# Patient Record
Sex: Female | Born: 1950 | Race: White | Hispanic: No | Marital: Married | Smoking: Never smoker
Health system: Southern US, Community
[De-identification: ages and names within clinical notes are randomized; demographics above are authoritative.]

## PROBLEM LIST (undated history)

## (undated) DIAGNOSIS — I4891 Unspecified atrial fibrillation: Secondary | ICD-10-CM

## (undated) DIAGNOSIS — T7840XA Allergy, unspecified, initial encounter: Secondary | ICD-10-CM

## (undated) DIAGNOSIS — R002 Palpitations: Secondary | ICD-10-CM

## (undated) DIAGNOSIS — F418 Other specified anxiety disorders: Secondary | ICD-10-CM

## (undated) DIAGNOSIS — F419 Anxiety disorder, unspecified: Secondary | ICD-10-CM

## (undated) DIAGNOSIS — E039 Hypothyroidism, unspecified: Secondary | ICD-10-CM

## (undated) DIAGNOSIS — S62101A Fracture of unspecified carpal bone, right wrist, initial encounter for closed fracture: Secondary | ICD-10-CM

## (undated) DIAGNOSIS — K219 Gastro-esophageal reflux disease without esophagitis: Secondary | ICD-10-CM

## (undated) DIAGNOSIS — K589 Irritable bowel syndrome without diarrhea: Secondary | ICD-10-CM

## (undated) DIAGNOSIS — E079 Disorder of thyroid, unspecified: Secondary | ICD-10-CM

## (undated) DIAGNOSIS — B001 Herpesviral vesicular dermatitis: Secondary | ICD-10-CM

## (undated) HISTORY — DX: Palpitations: R00.2

## (undated) HISTORY — DX: Anxiety disorder, unspecified: F41.9

## (undated) HISTORY — PX: TUBAL LIGATION: SHX77

## (undated) HISTORY — PX: BREAST EXCISIONAL BIOPSY: SUR124

## (undated) HISTORY — DX: Gastro-esophageal reflux disease without esophagitis: K21.9

## (undated) HISTORY — DX: Unspecified atrial fibrillation: I48.91

## (undated) HISTORY — DX: Disorder of thyroid, unspecified: E07.9

## (undated) HISTORY — DX: Other specified anxiety disorders: F41.8

## (undated) HISTORY — DX: Allergy, unspecified, initial encounter: T78.40XA

## (undated) HISTORY — DX: Herpesviral vesicular dermatitis: B00.1

## (undated) HISTORY — PX: HERNIA REPAIR: SHX51

---

## 1997-07-25 ENCOUNTER — Encounter: Admission: RE | Admit: 1997-07-25 | Discharge: 1997-08-07 | Payer: Self-pay | Admitting: *Deleted

## 1997-10-23 ENCOUNTER — Other Ambulatory Visit: Admission: RE | Admit: 1997-10-23 | Discharge: 1997-10-23 | Payer: Self-pay | Admitting: *Deleted

## 1998-06-12 ENCOUNTER — Ambulatory Visit (HOSPITAL_COMMUNITY): Admission: RE | Admit: 1998-06-12 | Discharge: 1998-06-12 | Payer: Self-pay | Admitting: Gastroenterology

## 1999-01-10 ENCOUNTER — Other Ambulatory Visit: Admission: RE | Admit: 1999-01-10 | Discharge: 1999-01-10 | Payer: Self-pay | Admitting: *Deleted

## 1999-02-26 ENCOUNTER — Ambulatory Visit (HOSPITAL_COMMUNITY): Admission: RE | Admit: 1999-02-26 | Discharge: 1999-02-26 | Payer: Self-pay | Admitting: General Surgery

## 1999-02-26 ENCOUNTER — Encounter (INDEPENDENT_AMBULATORY_CARE_PROVIDER_SITE_OTHER): Payer: Self-pay | Admitting: Specialist

## 1999-02-26 ENCOUNTER — Encounter: Payer: Self-pay | Admitting: General Surgery

## 2000-02-26 ENCOUNTER — Other Ambulatory Visit: Admission: RE | Admit: 2000-02-26 | Discharge: 2000-02-26 | Payer: Self-pay | Admitting: *Deleted

## 2000-04-29 ENCOUNTER — Other Ambulatory Visit: Admission: RE | Admit: 2000-04-29 | Discharge: 2000-04-29 | Payer: Self-pay | Admitting: *Deleted

## 2000-04-29 ENCOUNTER — Encounter (INDEPENDENT_AMBULATORY_CARE_PROVIDER_SITE_OTHER): Payer: Self-pay

## 2000-07-06 ENCOUNTER — Other Ambulatory Visit: Admission: RE | Admit: 2000-07-06 | Discharge: 2000-07-06 | Payer: Self-pay | Admitting: *Deleted

## 2005-03-13 ENCOUNTER — Ambulatory Visit: Payer: Self-pay | Admitting: Internal Medicine

## 2006-05-13 ENCOUNTER — Ambulatory Visit (HOSPITAL_COMMUNITY): Admission: RE | Admit: 2006-05-13 | Discharge: 2006-05-13 | Payer: Self-pay | Admitting: Internal Medicine

## 2007-02-17 DIAGNOSIS — K219 Gastro-esophageal reflux disease without esophagitis: Secondary | ICD-10-CM

## 2007-02-17 HISTORY — DX: Gastro-esophageal reflux disease without esophagitis: K21.9

## 2010-04-19 ENCOUNTER — Emergency Department (HOSPITAL_COMMUNITY): Admission: EM | Admit: 2010-04-19 | Discharge: 2010-04-20 | Payer: Self-pay | Admitting: Internal Medicine

## 2011-07-27 ENCOUNTER — Ambulatory Visit: Payer: Self-pay | Admitting: Family Medicine

## 2011-07-27 VITALS — BP 124/80 | HR 68 | Temp 97.8°F | Resp 16 | Ht 67.5 in | Wt 171.8 lb

## 2011-07-27 DIAGNOSIS — J019 Acute sinusitis, unspecified: Secondary | ICD-10-CM

## 2011-07-27 MED ORDER — AMOXICILLIN 875 MG PO TABS: 1750.0000 mg | ORAL_TABLET | Freq: Two times a day (BID) | ORAL | Status: AC

## 2011-07-27 MED ORDER — PREDNISONE 20 MG PO TABS: ORAL_TABLET | ORAL | Status: AC

## 2011-07-27 NOTE — Progress Notes (Signed)
  Subjective:    Patient ID: Shannon Adkins, female    DOB: 11/21/1950, 61 y.o.   MRN: [id number]  HPI 61 yo female with URI symptoms off and on since Christmas.  This episode for about week.  Cough, facial pressure, sinus pain, yellow phlegm from nose and with coughing.  NO fever.  ST initially, now better.  NO ear probs, nausea, headache.  Tried OTC generic sudafed one day - didn't do anything.  No antibiotics.    Review of Systems Negative except as per HPI     Objective:   Physical Exam  Constitutional: She appears well-developed. No distress.  HENT:  Right Ear: Tympanic membrane, external ear and ear canal normal. Tympanic membrane is not injected, not scarred, not perforated, not erythematous, not retracted and not bulging.  Left Ear: Tympanic membrane, external ear and ear canal normal. Tympanic membrane is not injected, not scarred, not perforated, not erythematous, not retracted and not bulging.  Nose: Mucosal edema present. No rhinorrhea. Right sinus exhibits no maxillary sinus tenderness and no frontal sinus tenderness. Left sinus exhibits maxillary sinus tenderness. Left sinus exhibits no frontal sinus tenderness.  Mouth/Throat: Uvula is midline, oropharynx is clear and moist and mucous membranes are normal. No oropharyngeal exudate or tonsillar abscesses.  Cardiovascular: Normal rate, regular rhythm, normal heart sounds and intact distal pulses.   No murmur heard. Pulmonary/Chest: Effort normal and breath sounds normal. No respiratory distress. She has no wheezes. She has no rales.  Lymphadenopathy:       Head (right side): No submandibular and no preauricular adenopathy present.       Head (left side): No submandibular and no preauricular adenopathy present.       Right cervical: No superficial cervical and no posterior cervical adenopathy present.      Left cervical: No superficial cervical and no posterior cervical adenopathy present.       Right: No supraclavicular  adenopathy present.       Left: No supraclavicular adenopathy present.  Skin: Skin is warm and dry.          Assessment & Plan:  Sinusitis - amox and prednisone

## 2011-07-31 ENCOUNTER — Telehealth: Payer: Self-pay

## 2011-07-31 NOTE — Telephone Encounter (Signed)
Patient thinks she is have an allergic reaction to the medication she is taking she has broken out in a rash, please contact to advice her on what she needs to do.

## 2011-07-31 NOTE — Telephone Encounter (Signed)
Spoke with patient, she stated Dr. Georgiana Shore started her on Amoxicillin and she started it Sunday pm.  Patient woke up this morning and had hives on her body, and itching.    Patient has stopped the Amox, and is still using Prednisone and Claritin to help with symptoms.  She is not having any SOB or swelling, but complains that she is still having the same symptoms as when she was seen, plus her left eye feels irritated.    Please advise recommendation of new abx and other meds to help with hives.  Call into [facility].

## 2011-08-01 ENCOUNTER — Telehealth: Payer: Self-pay | Admitting: *Deleted

## 2011-08-01 MED ORDER — TRIAMCINOLONE ACETONIDE 0.1 % EX CREA: TOPICAL_CREAM | Freq: Two times a day (BID) | CUTANEOUS | Status: AC

## 2011-08-01 MED ORDER — CLARITHROMYCIN ER 500 MG PO TB24: 1000.0000 mg | ORAL_TABLET | Freq: Every day | ORAL | Status: AC

## 2011-08-01 NOTE — Telephone Encounter (Signed)
.  [facility] PT STATES SHE HAD A REACTION TO THE AMOX AND NEEDED TO KNOW WHAT TO TAKE IN THE PLACE OF IT. HAVE DEVELOPED HIVES AND WOULD LIKE TO HAVE A CREAM, ALSO WOULD LIKE TO HAVE SOME DROPS FOR HER EYE. PLEASE CALL [phone]  [facility] [address]

## 2011-08-01 NOTE — Telephone Encounter (Signed)
Bertram Savin [place], [place] 07/31/2011 3:48 PM Signed  Spoke with patient, she stated Dr. Georgiana Shore started her on Amoxicillin and she started it Sunday pm. Patient woke up this morning and had hives on her body, and itching.  Patient has stopped the Amox, and is still using Prednisone and Claritin to help with symptoms. She is not having any SOB or swelling, but complains that she is still having the same symptoms as when she was seen, plus her left eye feels irritated.  Please advise recommendation of new abx and other meds to help with hives. Call into [facility]. Lauretta Grill 07/31/2011 9:47 AM Signed  Patient thinks she is have an allergic reaction to the medication she is taking she has broken out in a rash, please contact to advice her on what she needs to do.

## 2011-08-01 NOTE — Telephone Encounter (Signed)
Pt has another chart please disregard .

## 2011-08-01 NOTE — Telephone Encounter (Signed)
Called pt to notify of new Rx. Pt also requested triamcinolone cream for her hives. Called in rx for triamcinolone per Chelle's verbal order as written

## 2011-08-01 NOTE — Telephone Encounter (Signed)
She'll need evaluation for her eye.  Biaxin Rx'd for her sinusitis.

## 2012-05-10 ENCOUNTER — Other Ambulatory Visit: Payer: Self-pay | Admitting: Family Medicine

## 2012-05-11 NOTE — Telephone Encounter (Signed)
Needs OV.  

## 2013-09-07 ENCOUNTER — Ambulatory Visit (INDEPENDENT_AMBULATORY_CARE_PROVIDER_SITE_OTHER): Payer: No Typology Code available for payment source | Admitting: Family Medicine

## 2013-09-07 VITALS — BP 110/72 | HR 61 | Temp 98.0°F | Resp 16 | Ht 67.5 in | Wt 165.0 lb

## 2013-09-07 DIAGNOSIS — F32A Depression, unspecified: Secondary | ICD-10-CM | POA: Insufficient documentation

## 2013-09-07 DIAGNOSIS — J309 Allergic rhinitis, unspecified: Secondary | ICD-10-CM

## 2013-09-07 DIAGNOSIS — F329 Major depressive disorder, single episode, unspecified: Secondary | ICD-10-CM

## 2013-09-07 DIAGNOSIS — F419 Anxiety disorder, unspecified: Secondary | ICD-10-CM

## 2013-09-07 DIAGNOSIS — F341 Dysthymic disorder: Secondary | ICD-10-CM

## 2013-09-07 HISTORY — DX: Allergic rhinitis, unspecified: J30.9

## 2013-09-07 HISTORY — DX: Depression, unspecified: F32.A

## 2013-09-07 MED ORDER — MONTELUKAST SODIUM 10 MG PO TABS: 10.0000 mg | ORAL_TABLET | Freq: Every day | ORAL | Status: DC

## 2013-09-07 MED ORDER — FLUTICASONE PROPIONATE 50 MCG/ACT NA SUSP: 2.0000 | Freq: Every day | NASAL | Status: DC

## 2013-09-07 MED ORDER — VENLAFAXINE HCL ER 75 MG PO CP24: 75.0000 mg | ORAL_CAPSULE | Freq: Every day | ORAL | Status: DC

## 2013-09-07 MED ORDER — FEXOFENADINE-PSEUDOEPHED ER 60-120 MG PO TB12: 1.0000 | ORAL_TABLET | Freq: Two times a day (BID) | ORAL | Status: DC

## 2013-09-07 MED ORDER — CITALOPRAM HYDROBROMIDE 20 MG PO TABS: 20.0000 mg | ORAL_TABLET | Freq: Every day | ORAL | Status: DC

## 2013-09-07 NOTE — Progress Notes (Addendum)
Subjective:  This chart was scribed for Ethelda Chick, MD by Leone Payor, ED Scribe. This patient was seen in room 9 and the patient's care was started 4:24 PM.    Patient ID: Shannon Adkins, female    DOB: Nov 13, 1950, 63 y.o.   MRN: [id number]  HPI HPI Comments: Shannon Adkins is a 63 y.o. female who presents to [facility] requesting medication refill/change today for anxiety with depression. She reports taking citalopram for many years but states it makes her sleep more (~10 hours nightly). She is wondering if there is any other medication that would be less sedating. Patient states she has recently been struggling with her anxiety but states she is better now. Previous use of Prozac 20mg  daily without good control of anxiety.  She is inquiring about Allegra-D to take in addition with montelukast. She is also requesting rx for Flonase.  +suffering currently with nasal congestion, rhinorrhea, itchy eyes, and nose.   Her mother passed at 19 due to Alzheimer's. She had a history of osteoporosis.  Her father passed at 55 due to heart and lung disease due to working in asbestos. He had history of HTN, CVA.   Past Medical History  Diagnosis Date  . Allergy   . Anxiety   . Thyroid disease    Past Surgical History  Procedure Laterality Date  . Cesarean section    . Hernia repair      Allergies  Allergen Reactions  . Amoxicillin Hives    Pt called in to report on 07/31/11  . Sulfamethoxazole-Trimethoprim     REACTION: conjunctivitis   No current outpatient prescriptions on file prior to visit.   No current facility-administered medications on file prior to visit.   History   Social History  . Marital Status: Married    Spouse Name: N/A    Number of Children: 3  . Years of Education: N/A   Occupational History  . Not on file.   Social History Main Topics  . Smoking status: Never Smoker   . Smokeless tobacco: Not on file  . Alcohol Use: 1.8 oz/week    3 Glasses of wine per  week  . Drug Use: No  . Sexual Activity: Yes   Other Topics Concern  . Not on file   Social History Narrative  . No narrative on file   Family History  Problem Relation Age of Onset  . Osteoporosis Mother   . Hypertension Father      Review of Systems  Constitutional: Negative for fever, chills, diaphoresis and fatigue.       Medication change and refill  HENT: Positive for congestion, postnasal drip and rhinorrhea. Negative for ear pain, sinus pressure, sore throat, trouble swallowing and voice change.   Respiratory: Negative for cough and shortness of breath.   Neurological: Negative for headaches.  Psychiatric/Behavioral: Negative for suicidal ideas, sleep disturbance, self-injury and dysphoric mood. The patient is not nervous/anxious (currently).        Objective:   Physical Exam  Nursing note and vitals reviewed. Constitutional: She is oriented to person, place, and time. She appears well-developed and well-nourished. No distress.  HENT:  Head: Normocephalic and atraumatic.  Right Ear: Tympanic membrane, external ear and ear canal normal.  Left Ear: Tympanic membrane, external ear and ear canal normal.  Mouth/Throat: Uvula is midline, oropharynx is clear and moist and mucous membranes are normal.  Eyes: Conjunctivae and EOM are normal. Pupils are equal, round, and reactive to light.  Neck: Normal  range of motion. Neck supple. No thyromegaly present.  Cardiovascular: Normal rate, regular rhythm and normal heart sounds.  Exam reveals no gallop and no friction rub.   No murmur heard. Pulmonary/Chest: Effort normal and breath sounds normal. No respiratory distress. She has no wheezes. She has no rales. She exhibits no tenderness.  Abdominal: She exhibits no distension.  Lymphadenopathy:    She has no cervical adenopathy.  Neurological: She is alert and oriented to person, place, and time.  Skin: Skin is warm and dry. She is not diaphoretic.  Psychiatric: She has a  normal mood and affect. Her behavior is normal. Judgment and thought content normal.    Filed Vitals:   09/07/13 1531  BP: 110/72  Pulse: 61  Temp: 98 F (36.7 C)  TempSrc: Oral  Resp: 16  Height: 5' 7.5" (1.715 m)  Weight: 165 lb (74.844 kg)  SpO2: 98%       Assessment & Plan:   1. Allergic rhinitis, cause unspecified   2. Anxiety and depression    1.  Allergic Rhinitis:  Worsening; rx for Singulair, Allegra-D, Flonase.   2.  Anxiety with depression: worsening recently; wean Citalopram 40mg  to 20mg  daily for two weeks and then decrease to 10mg  daily for two weeks and then stop.  Rx for Effexor XR 75mg  one tablet daily for one month and then increase to two tablets daily.    Meds ordered this encounter  Medications  . DISCONTD: montelukast (SINGULAIR) 10 MG tablet    Sig: Take 10 mg by mouth at bedtime.  . Thyroid (NATURE-THROID PO)    Sig: Take 1 mg by mouth.  . Loratadine (CLARITIN PO)    Sig: Take by mouth.  . triamcinolone (NASACORT) 55 MCG/ACT AERO nasal inhaler    Sig: Place 2 sprays into the nose daily.  . citalopram (CELEXA) 20 MG tablet    Sig: Take 1 tablet (20 mg total) by mouth daily.    Dispense:  30 tablet    Refill:  0  . venlafaxine XR (EFFEXOR-XR) 75 MG 24 hr capsule    Sig: Take 1-2 capsules (75-150 mg total) by mouth daily with breakfast.    Dispense:  180 capsule    Refill:  1  . montelukast (SINGULAIR) 10 MG tablet    Sig: Take 1 tablet (10 mg total) by mouth at bedtime.    Dispense:  90 tablet    Refill:  3  . fexofenadine-pseudoephedrine (ALLEGRA-D) 60-120 MG per tablet    Sig: Take 1 tablet by mouth 2 (two) times daily.    Dispense:  60 tablet    Refill:  3  . fluticasone (FLONASE) 50 MCG/ACT nasal spray    Sig: Place 2 sprays into both nostrils daily.    Dispense:  16 g    Refill:  11    I personally performed the services described in this documentation, which was scribed in my presence.  The recorded information has been reviewed and  is accurate.  Nilda SimmerKristi Jaslen Adcox, M.D.  [facility]  [place] [address] [place], [place]  [id number] [phone]) [phone] phone [phone]) [phone] fax

## 2013-09-10 ENCOUNTER — Telehealth: Payer: Self-pay | Admitting: Family Medicine

## 2013-09-10 NOTE — Telephone Encounter (Signed)
Spoke with pt. Pt says pharm is concerned because the rx says take 1-2 qd. Tried to call pharm. They are closed for lunch. Will try again later

## 2013-09-10 NOTE — Telephone Encounter (Signed)
Dr. Katrinka BlazingSmith, spoke with pharm. It sounds like the ins doesn't want to pay for 75 mg 2 tab daily, and we can't give her 150 mg because it's capsules and she wouldn't be able to split them in half. Didn't know if we could call 75 mg qd #30 and then check status in 4 weeks and then increase at that time?? Please advise.

## 2013-09-10 NOTE — Telephone Encounter (Signed)
The patient states that the pharmacy needs clarification of dosage for Effexor rx that Dr. Katrinka BlazingSmith prescribed on 09/07/13.  The patient states that the pharmacy has sent the rx back to our office, but she is following up on the situation.  The patient requests return call at [phone].

## 2013-09-12 MED ORDER — VENLAFAXINE HCL ER 75 MG PO CP24: 75.0000 mg | ORAL_CAPSULE | Freq: Every day | ORAL | Status: DC

## 2013-09-12 NOTE — Telephone Encounter (Signed)
Patient notified and voiced understanding.

## 2013-09-12 NOTE — Telephone Encounter (Signed)
Yes, I have resent rx to take one tablet daily.  At follow-up, we will increase dose if indicated.  Please advise patient.  Thanks!

## 2013-09-14 NOTE — Progress Notes (Signed)
Scheduled with Dr Katrinka BlazingSmith for 7/8 @ 10am for a 3 month reck

## 2013-10-06 ENCOUNTER — Telehealth: Payer: Self-pay

## 2013-10-06 NOTE — Telephone Encounter (Signed)
Currently taking effexor and it causes night sweats, insomnia, self deprecation thoughts.  She states that she can not sleep at all. The Insomnia began 4-5 days after she started medication and she has been on the medication for approx. One month.  She is requesting that Dr. Katrinka BlazingSmith recommended that she choose between effexor or cymbalta. She chose effexor because of cost but now would like to be switched to cymbalta.  Advised patient that Dr. Katrinka BlazingSmith is out of the office today and I would try to get a PA to help her. She said she is fine with that.

## 2013-10-06 NOTE — Telephone Encounter (Signed)
venlafaxine XR (EFFEXOR-XR) 75 MG 24 hr capsule  Night sweats, insomnia, deprecating throughts.   [phone]  [phone]  Dr. Katrinka BlazingSmith   Would like to try symbalta - [facility] [address]

## 2013-10-07 MED ORDER — DULOXETINE HCL 30 MG PO CPEP: 30.0000 mg | ORAL_CAPSULE | Freq: Every day | ORAL | Status: DC

## 2013-10-07 NOTE — Telephone Encounter (Signed)
LMVM Cymbalta was sent to pharmacy.  Stop effexor. Call back with any further questions.

## 2013-10-07 NOTE — Telephone Encounter (Signed)
Messages reviewed and agree with plan.

## 2013-10-07 NOTE — Telephone Encounter (Signed)
Stop the Effexor.  Cymbalta may have similar adverse effects, but I think it's reasonable to try that medication.  Start with 30mg  daily x 1 week, then increase to 60mg  daily   To you FYI Dr Katrinka BlazingSmith

## 2013-10-13 ENCOUNTER — Telehealth: Payer: Self-pay

## 2013-10-13 MED ORDER — DULOXETINE HCL 30 MG PO CPEP: ORAL_CAPSULE | ORAL | Status: DC

## 2013-10-13 MED ORDER — DULOXETINE HCL 60 MG PO CPEP: ORAL_CAPSULE | ORAL | Status: DC

## 2013-10-13 NOTE — Telephone Encounter (Signed)
PA needed for duloxetine. I was able to get approval for duloxetine, medication itself in all mg strengths, but original Rx for titrating up will be denied d/t only 1 tab per day being covered. Can we change Rxs to one Rx for 30 mg to take 1 QD for 1 week, and then a separate Rx for 60 mg 1 QD after finishing 1 week of the 30 mg? Pended for your review.

## 2013-10-13 NOTE — Telephone Encounter (Signed)
I approved Duloxetine 30mg  one tablet daily #30 no refills; also sent in Duloxetine 60mg  one tablet daily to start after completing 30mg  for one month.

## 2013-10-14 NOTE — Telephone Encounter (Signed)
LMVM that RX was sent to the pharmacy.  If patient has any further questions she can call us back.

## 2013-11-09 ENCOUNTER — Telehealth: Payer: Self-pay | Admitting: Family Medicine

## 2013-11-09 MED ORDER — DULOXETINE HCL 60 MG PO CPEP: ORAL_CAPSULE | ORAL | Status: DC

## 2013-11-09 NOTE — Telephone Encounter (Signed)
Patient requesting refill of Duloxetine 60mg  daily; follow-up in one month.

## 2013-11-16 ENCOUNTER — Telehealth: Payer: Self-pay

## 2013-11-16 NOTE — Telephone Encounter (Signed)
PT WANTED THE DR SMITH'S ASST THE CYMBALTA  AND CELEXA STILL DOESN'T HELP HER SLEEP, AND SHE TOOK ONE ALSO THIS MORNING AND NOTHING HAS HELPED WOULD LIKE TO SPEAK WITH SOMEONE ABOUT TRYING SOMETHING ELSE. PLEASE CALL [id number]

## 2013-11-16 NOTE — Telephone Encounter (Signed)
Pt had this same problem with Effexor. Asking for something else for depression and insomnia.

## 2013-11-20 NOTE — Telephone Encounter (Signed)
Lmom to call back for clarification.

## 2013-11-20 NOTE — Telephone Encounter (Signed)
Please call and clarify message ----1.  We stopped Citalopram in 09/2013 because it caused excessive sleepiness.  We switched to Effexor which caused insomnia, so then we switched to Cymbalta.  Is she also having insomnia with Cymbalta?  Did the excessive sleepiness go away when she stopped Citalopram?

## 2013-11-23 NOTE — Telephone Encounter (Signed)
Lm for rtn call 

## 2013-11-28 NOTE — Telephone Encounter (Signed)
Lm for rtn call 

## 2013-11-29 ENCOUNTER — Encounter: Payer: Self-pay | Admitting: *Deleted

## 2013-11-29 NOTE — Telephone Encounter (Signed)
Sent unable to reach letter.

## 2013-12-02 ENCOUNTER — Telehealth: Payer: Self-pay | Admitting: Family Medicine

## 2013-12-02 MED ORDER — FLUOXETINE HCL 20 MG PO TABS
20.0000 mg | ORAL_TABLET | Freq: Every day | ORAL | Status: DC
Start: 2013-12-02 — End: 2013-12-14

## 2013-12-02 NOTE — Telephone Encounter (Signed)
Patient states that the Effexor and the Cymbalta are her to have insomnia. States that she would like to try Prozac instead. [facility] [address]  [phone]

## 2013-12-02 NOTE — Telephone Encounter (Signed)
Call --- Prozac 20mg  one tablet daily sent to pharmacy; recommend taking Prozac every morning.

## 2013-12-03 NOTE — Telephone Encounter (Signed)
LMOM that this was sent to pharm.

## 2013-12-14 ENCOUNTER — Encounter: Payer: Self-pay | Admitting: Family Medicine

## 2013-12-14 ENCOUNTER — Ambulatory Visit (INDEPENDENT_AMBULATORY_CARE_PROVIDER_SITE_OTHER): Payer: No Typology Code available for payment source | Admitting: Family Medicine

## 2013-12-14 VITALS — BP 126/82 | HR 77 | Temp 97.7°F | Resp 16 | Ht 61.75 in | Wt 164.6 lb

## 2013-12-14 DIAGNOSIS — B009 Herpesviral infection, unspecified: Secondary | ICD-10-CM

## 2013-12-14 DIAGNOSIS — F341 Dysthymic disorder: Secondary | ICD-10-CM

## 2013-12-14 DIAGNOSIS — F419 Anxiety disorder, unspecified: Secondary | ICD-10-CM

## 2013-12-14 DIAGNOSIS — B001 Herpesviral vesicular dermatitis: Secondary | ICD-10-CM

## 2013-12-14 DIAGNOSIS — Z23 Encounter for immunization: Secondary | ICD-10-CM

## 2013-12-14 DIAGNOSIS — F32A Depression, unspecified: Secondary | ICD-10-CM

## 2013-12-14 DIAGNOSIS — F40248 Other situational type phobia: Secondary | ICD-10-CM

## 2013-12-14 DIAGNOSIS — F4010 Social phobia, unspecified: Secondary | ICD-10-CM

## 2013-12-14 DIAGNOSIS — F329 Major depressive disorder, single episode, unspecified: Secondary | ICD-10-CM

## 2013-12-14 MED ORDER — ACYCLOVIR 5 % EX CREA: 1.0000 "application " | TOPICAL_CREAM | CUTANEOUS | Status: DC

## 2013-12-14 MED ORDER — FLUOXETINE HCL 40 MG PO CAPS
40.0000 mg | ORAL_CAPSULE | Freq: Every day | ORAL | Status: DC
Start: 2013-12-14 — End: 2014-01-31

## 2013-12-14 MED ORDER — PROPRANOLOL HCL 20 MG PO TABS: 40.0000 mg | ORAL_TABLET | Freq: Once | ORAL | Status: DC | PRN

## 2013-12-14 MED ORDER — ZOSTER VACCINE LIVE 19400 UNT/0.65ML ~~LOC~~ SOLR: 0.6500 mL | Freq: Once | SUBCUTANEOUS | Status: DC

## 2013-12-14 NOTE — Progress Notes (Signed)
This chart was scribed for Ethelda Chick, MD by Ardelia Mems, Scribe. This patient was seen in room 22 and the patient's care was started at 10:49 AM.  Subjective:    Patient ID: Shannon Adkins, female    DOB: [birth year], 63 y.o.   MRN: [id number]  12/14/2013  Medication Refill and Anxiety  HPI  HPI Comments: Shannon Adkins is a 63 y.o. female who [facility] for a three month follow-up evaluation of anxiety and depression, as well as for medication refills. She was seen on 09/07/13 and was switched from Citalopram to Effexor. She called and said that she was having night sweats and insomnia with Effexor and she was then switched to Cymbalta. She states that the Cymbalta worked for a couple weeks, but that over time, she began having night sweats and nightmares. She then called on 6/26 and wanted to switch to Prozac. She is prescribed 20 mg of Prozac currently and she increased herself to 40 mg at home for the past 2 weeks, which she states has been helping her anxiety and depression. She also states that she is not having any side effects on this medication at this dosage. She states that she has been sleeping very well since being on 40 mg Prozac. She states that she has typically been sleeping from 11 PM - 8 AM.   She states that she takes Propranolol as needed for singing. She uses Propanolol once weekly before church.  She is due for a refill.   She also is requesting a refill of Zovirax cream; she uses this for cold sores and it works very well.   She is also wanting to have a shingles vaccination today. She states that she is a semi-vegetarian who eats eggs and fish. She has no other complaints or symptoms that she would like to discuss today.   Review of Systems  Constitutional: Positive for diaphoresis (resolved). Negative for fever, chills, activity change, appetite change and fatigue.  HENT: Negative for mouth sores.   Neurological: Negative  for dizziness, tremors, syncope, speech difficulty, weakness, light-headedness, numbness and headaches.  Psychiatric/Behavioral: Positive for sleep disturbance (resolved). Negative for suicidal ideas, self-injury and dysphoric mood. The patient is nervous/anxious (resolved).     Past Medical History  Diagnosis Date   Allergy    Anxiety    Thyroid disease    Past Surgical History  Procedure Laterality Date   Cesarean section     Hernia repair      Allergies  Allergen Reactions   Amoxicillin Hives    Pt called in to report on 07/31/11   Sulfamethoxazole-Trimethoprim     REACTION: conjunctivitis   Current Outpatient Prescriptions  Medication Sig Dispense Refill   fexofenadine-pseudoephedrine (ALLEGRA-D) 60-120 MG per tablet Take 1 tablet by mouth 2 (two) times daily.  60 tablet  3   Loratadine (CLARITIN PO) Take by mouth.       montelukast (SINGULAIR) 10 MG tablet Take 1 tablet (10 mg total) by mouth at bedtime.  90 tablet  3   acyclovir cream (ZOVIRAX) 5 % Apply 1 application topically every 3 (three) hours.  5 g  2   FLUoxetine (PROZAC) 40 MG capsule Take 1 capsule (40 mg total) by mouth daily.  90 capsule  3   propranolol (INDERAL) 20 MG tablet Take 2-3 tablets (40-60 mg total) by mouth once as needed.  30 tablet  3   zoster vaccine live, PF, (ZOSTAVAX) 40981 UNT/0.65ML  injection Inject 19,400 Units into the skin once.  0.65 mL  0   No current facility-administered medications for this visit.   History   Social History   Marital Status: Married    Spouse Name: N/A    Number of Children: 3   Years of Education: N/A   Occupational History   Not on file.   Social History Main Topics   Smoking status: Never Smoker    Smokeless tobacco: Not on file   Alcohol Use: 1.8 oz/week    3 Glasses of wine per week   Drug Use: No   Sexual Activity: Yes   Other Topics Concern   Not on file   Social History Narrative   No narrative on file   Family  History  Problem Relation Age of Onset   Osteoporosis Mother    Hypertension Father         Objective:    BP 126/82   Pulse 77   Temp(Src) 97.7 F (36.5 C) (Oral)   Resp 16   Ht 5' 1.75" (1.568 m)   Wt 164 lb 9.6 oz (74.662 kg)   BMI 30.37 kg/m2   SpO2 95%  Physical Exam  Nursing note and vitals reviewed. Constitutional: She is oriented to person, place, and time. She appears well-developed and well-nourished. No distress.  HENT:  Head: Normocephalic and atraumatic.  Right Ear: External ear normal.  Left Ear: External ear normal.  Nose: Nose normal.  Mouth/Throat: Oropharynx is clear and moist.  Eyes: Conjunctivae and EOM are normal. Pupils are equal, round, and reactive to light.  Neck: Normal range of motion. Neck supple. Carotid bruit is not present. No tracheal deviation present. No thyromegaly present.  Cardiovascular: Normal rate, regular rhythm, normal heart sounds and intact distal pulses.  Exam reveals no gallop and no friction rub.   No murmur heard. Pulmonary/Chest: Effort normal and breath sounds normal. No respiratory distress. She has no wheezes. She has no rales.  Abdominal: There is no tenderness.  Musculoskeletal: Normal range of motion.  Lymphadenopathy:    She has no cervical adenopathy.  Neurological: She is alert and oriented to person, place, and time. No cranial nerve deficit.  Skin: Skin is warm and dry. No rash noted. She is not diaphoretic. No erythema. No pallor.  Psychiatric: She has a normal mood and affect. Her behavior is normal.   No results found for this or any previous visit.     Assessment & Plan:  Need for shingles vaccine - Plan: zoster vaccine live, PF, (ZOSTAVAX) [id number] UNT/0.65ML injection  Anxiety and depression  Herpes labialis  Fear of public speaking  1. Anxiety with depression: controlled with Prozac 40mg  daily; refill provided; RTC six months. 2.  Herpes Labialis: New to this provider; refill of Zovirax cream  provided. 3.  Fear of public speaking: new to this provider; refill of Propranolol provided for PRN use. 4.  Need for shingles vaccine: rx for Zostavax provided; advised to check with insurance regarding coverage and appropriate location for administration of vaccine.  Meds ordered this encounter  Medications   FLUoxetine (PROZAC) 40 MG capsule    Sig: Take 1 capsule (40 mg total) by mouth daily.    Dispense:  90 capsule    Refill:  3   acyclovir cream (ZOVIRAX) 5 %    Sig: Apply 1 application topically every 3 (three) hours.    Dispense:  5 g    Refill:  2   zoster vaccine live, PF, (  ZOSTAVAX) [id number] UNT/0.65ML injection    Sig: Inject 19,400 Units into the skin once.    Dispense:  0.65 mL    Refill:  0   propranolol (INDERAL) 20 MG tablet    Sig: Take 2-3 tablets (40-60 mg total) by mouth once as needed.    Dispense:  30 tablet    Refill:  3    Return in about 6 months (around 06/16/2014) for recheck.   I personally performed the services described in this documentation, which was scribed in my presence.  The recorded information has been reviewed and is accurate.  Nilda SimmerKristi Smith, M.D.  [facility]   [place] [address] [place], [place]  [id number] [phone] phone [phone]) [phone] fax

## 2013-12-19 ENCOUNTER — Encounter: Payer: Self-pay | Admitting: Family Medicine

## 2013-12-19 DIAGNOSIS — F40248 Other situational type phobia: Secondary | ICD-10-CM | POA: Insufficient documentation

## 2013-12-19 DIAGNOSIS — B001 Herpesviral vesicular dermatitis: Secondary | ICD-10-CM | POA: Insufficient documentation

## 2013-12-19 HISTORY — DX: Other situational type phobia: F40.248

## 2013-12-22 ENCOUNTER — Telehealth: Payer: Self-pay | Admitting: *Deleted

## 2013-12-22 MED ORDER — ACYCLOVIR 5 % EX OINT: 1.0000 "application " | TOPICAL_OINTMENT | CUTANEOUS | Status: DC

## 2013-12-22 NOTE — Telephone Encounter (Signed)
FYI Pt can get the acyclovir in an ointment per prior authorization. Spoke to pharmacy- she usually got the ointment not a cream. They only stock ointment.  Called pt she wants ointment instead of cream. She has used ointment in the past- never tried the cream.  Changed script to ointment. Pharmacy notified and pt notified.

## 2014-01-04 ENCOUNTER — Telehealth: Payer: Self-pay

## 2014-01-04 NOTE — Telephone Encounter (Signed)
Pt is not doing well with 40mg  FLUoxetine (PROZAC), is having a hard time coping, and would like to know if she could increase it to 80mg , and if so can she do 90 day supply? Please advise pt

## 2014-01-05 MED ORDER — FLUOXETINE HCL 20 MG PO TABS: 20.0000 mg | ORAL_TABLET | Freq: Every day | ORAL | Status: DC

## 2014-01-05 NOTE — Telephone Encounter (Signed)
Spoke to pt, stress with family issues, prozac doing ok, but still having negative thinking, feeling very anxious with worry. She states you discussed  Changes in her medication if she found she was not doing as well. She would like to add the celexa or increase her prozac. She would like to find some relief with her medications. She would like your advise with this.   Last seen 12/14/2013  1. Anxiety with depression: controlled with Prozac 40mg  daily; refill provided; RTC six months FLUoxetine (PROZAC) 40 MG capsule  Sig: Take 1 capsule (40 mg total) by mouth daily.  Dispense: 90 capsule  Refill: 3   Please advise

## 2014-01-05 NOTE — Telephone Encounter (Signed)
Pt called back and wanted to add that Rowe Pavykrisit Smith had mentioned in the past adding 20mg  of  celexa to the 40 mg of prozac, please advise pt

## 2014-01-05 NOTE — Telephone Encounter (Signed)
Spoke to pt, she is aware.  She is contacting her insurance company for what is covered for hsv; she will contact us with that information when obtained.

## 2014-01-05 NOTE — Telephone Encounter (Signed)
Recommend increasing Prozac dose but increasing from 40mg  to 80mg  is a big increase.  Recommend adding 20mg  Prozac to 40mg  Prozac for a total of 60mg  daily.  I will send in new rx for 20mg  tablet.

## 2014-01-31 ENCOUNTER — Other Ambulatory Visit: Payer: Self-pay

## 2014-01-31 MED ORDER — FLUOXETINE HCL 60 MG PO TABS: 1.0000 | ORAL_TABLET | Freq: Every day | ORAL | Status: DC

## 2014-01-31 NOTE — Telephone Encounter (Signed)
Faxed req for 60 mg prozac. See Dr Michaelle Copas notes 01/04/14 phone message, inc to 60 mg QD. Sent Rx.

## 2014-02-23 ENCOUNTER — Telehealth: Payer: Self-pay

## 2014-02-23 NOTE — Telephone Encounter (Signed)
Patient came in to 31 tonight. States Dr. Katrinka Blazing prescribed acylovir ointment and a "coverage review" needs to be completed before patient can fill the rx with her ins company. PLease call [phone] to complete coverage review. Patient may be reached at [phone].

## 2014-02-25 ENCOUNTER — Ambulatory Visit (INDEPENDENT_AMBULATORY_CARE_PROVIDER_SITE_OTHER): Payer: No Typology Code available for payment source | Admitting: Family Medicine

## 2014-02-25 VITALS — BP 124/78 | HR 69 | Temp 97.5°F | Resp 16 | Ht 67.5 in | Wt 162.2 lb

## 2014-02-25 DIAGNOSIS — B999 Unspecified infectious disease: Secondary | ICD-10-CM

## 2014-02-25 DIAGNOSIS — Z23 Encounter for immunization: Secondary | ICD-10-CM

## 2014-02-25 DIAGNOSIS — R0683 Snoring: Secondary | ICD-10-CM

## 2014-02-25 DIAGNOSIS — B009 Herpesviral infection, unspecified: Secondary | ICD-10-CM

## 2014-02-25 DIAGNOSIS — F419 Anxiety disorder, unspecified: Secondary | ICD-10-CM

## 2014-02-25 DIAGNOSIS — F329 Major depressive disorder, single episode, unspecified: Secondary | ICD-10-CM

## 2014-02-25 DIAGNOSIS — R5381 Other malaise: Secondary | ICD-10-CM

## 2014-02-25 DIAGNOSIS — R0989 Other specified symptoms and signs involving the circulatory and respiratory systems: Secondary | ICD-10-CM

## 2014-02-25 DIAGNOSIS — F32A Depression, unspecified: Secondary | ICD-10-CM

## 2014-02-25 DIAGNOSIS — Z131 Encounter for screening for diabetes mellitus: Secondary | ICD-10-CM

## 2014-02-25 DIAGNOSIS — F341 Dysthymic disorder: Secondary | ICD-10-CM

## 2014-02-25 DIAGNOSIS — R0609 Other forms of dyspnea: Secondary | ICD-10-CM

## 2014-02-25 DIAGNOSIS — R5383 Other fatigue: Secondary | ICD-10-CM

## 2014-02-25 DIAGNOSIS — B001 Herpesviral vesicular dermatitis: Secondary | ICD-10-CM

## 2014-02-25 LAB — POCT URINALYSIS DIPSTICK
Bilirubin, UA: NEGATIVE
Glucose, UA: NEGATIVE
KETONES UA: NEGATIVE
NITRITE UA: NEGATIVE
PH UA: 6.5
PROTEIN UA: NEGATIVE
RBC UA: NEGATIVE
UROBILINOGEN UA: 0.2

## 2014-02-25 LAB — POCT CBC
GRANULOCYTE PERCENT: 75.4 % (ref 37–80)
HCT, POC: 40.6 % (ref 37.7–47.9)
Hemoglobin: 13.4 g/dL (ref 12.2–16.2)
Lymph, poc: 1.7 (ref 0.6–3.4)
MCH: 27.6 pg (ref 27–31.2)
MCHC: 33.1 g/dL (ref 31.8–35.4)
MCV: 83.4 fL (ref 80–97)
MID (CBC): 0.2 (ref 0–0.9)
MPV: 7.3 fL (ref 0–99.8)
PLATELET COUNT, POC: 283 10*3/uL (ref 142–424)
POC Granulocyte: 6 (ref 2–6.9)
POC LYMPH %: 21.5 % (ref 10–50)
POC MID %: 3.1 % (ref 0–12)
RBC: 4.86 M/uL (ref 4.04–5.48)
RDW, POC: 13 %
WBC: 7.9 10*3/uL (ref 4.6–10.2)

## 2014-02-25 MED ORDER — FLUOXETINE HCL 40 MG PO CAPS: 80.0000 mg | ORAL_CAPSULE | Freq: Every day | ORAL | Status: DC

## 2014-02-25 NOTE — Patient Instructions (Signed)

## 2014-02-25 NOTE — Progress Notes (Signed)
Subjective:   This chart was scribed for Nilda Simmer, MD by Andrew Au, ED Scribe. This patient was seen in room 9 and the patient's care was started at 3:26 PM.  Patient ID: Shannon Adkins, female    DOB: [birth year], 63 y.o.   MRN: [id number]  HPI Chief Complaint  Patient presents with  . Fatigue    Wants labs to check for anemia, thyroid & blood sugar  . Discuss Medication    Fluoxetine    HPI Comments: Shannon Adkins is a 63 y.o. female with hx anxiety and depression who presents to the [facility] complaining of fatigue. This is a patient of mine last seen in July.  No medications were changed at that time.   Pt reports h/o of anemia and has had improvement due to doubling her iron. She last had hgb checked in 2014 which was normal.   Pt  c/o trouble sleeping. Pt reports she snores. She denies quitting breathing while sleeping. She denies waking up in the morning feeling refreshed. Pt reports she wakes up during the night 2-3 times to use the restroom. She states that she does not sleep well and often wakes up during the night thinking about work. Pt is interested in taking progesterone for trouble sleep. She has taken melatonin in past without relief. She reports her father and 2 sisters have sleep apnea.   Pt is requesting to check thyroid. She reports family hx of thyroid problems. Pt was taking nature thyroid which helped with cramping and her feet from being cold.   Pt c/o of her hair thinning. She reports family hx of thinning hair.    Pt also c/o dehydration. Pt reports she has been drinking more water. She reports she has cut back on coffee.    Pt believes her immune system is weak. Pt reports ever since last fall she becomes sick with a cold more often; she suffers with recurrent colds every two weeks. Pt is taking B6 and B complex. Pt reports vitamins help.   Pt reports family history of familial angioedema but does not want to be tested unless  symptoms show.    Pt has not eaten today.   Pt feels more energized since switch from Celexa to Prozac.  She has much more energy during the day.  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  Work will continue to be very stressful for the next three months.  She would like to increase Prozac dose if possible.   Past Medical History  Diagnosis Date  . Allergy   . Anxiety   . Thyroid disease   . Herpes labialis    Past Surgical History  Procedure Laterality Date  . Cesarean section    . Hernia repair     Prior to Admission medications   Medication Sig Start Date End Date Taking? Authorizing Provider  acyclovir ointment (ZOVIRAX) 5 % Apply 1 application topically every 3 (three) hours. 12/22/13  Yes Chelle S Jeffery, PA-C  fexofenadine-pseudoephedrine (ALLEGRA-D) 60-120 MG per tablet Take 1 tablet by mouth 2 (two) times daily. 09/07/13  Yes Ethelda Chick, MD  FLUoxetine HCl 60 MG TABS Take 1 tablet by mouth daily. 01/31/14  Yes Ethelda Chick, MD  Loratadine (CLARITIN PO) Take by mouth.   Yes Historical Provider,  MD  montelukast (SINGULAIR) 10 MG tablet Take 1 tablet (10 mg total) by mouth at bedtime. 09/07/13  Yes Ethelda Chick, MD  propranolol (INDERAL) 20 MG tablet Take 2-3 tablets (40-60 mg total) by mouth once as needed. 12/14/13  Yes Ethelda Chick, MD  zoster vaccine live, PF, (ZOSTAVAX) 16109 UNT/0.65ML injection Inject 19,400 Units into the skin once. 12/14/13  Yes Ethelda Chick, MD    Review of Systems  Constitutional: Positive for fatigue. Negative for fever, chills, diaphoresis, activity change, appetite change and unexpected weight change.  HENT: Negative for congestion, ear pain, postnasal drip, rhinorrhea, sinus pressure, sneezing and sore throat.   Eyes: Negative for visual disturbance.  Respiratory: Negative  for cough, shortness of breath and stridor.   Cardiovascular: Negative for chest pain, palpitations and leg swelling.  Gastrointestinal: Negative for nausea, vomiting, abdominal pain, diarrhea and constipation.  Endocrine: Negative for cold intolerance, heat intolerance, polydipsia, polyphagia and polyuria.  Skin: Negative for rash.  Neurological: Negative for dizziness, tremors, seizures, syncope, facial asymmetry, speech difficulty, weakness, light-headedness, numbness and headaches.  Psychiatric/Behavioral: Positive for sleep disturbance and dysphoric mood. Negative for suicidal ideas and self-injury. The patient is nervous/anxious.      Objective:   Physical Exam  Nursing note and vitals reviewed. Constitutional: She is oriented to person, place, and time. She appears well-developed and well-nourished. No distress.  HENT:  Head: Normocephalic and atraumatic.  Right Ear: External ear normal.  Left Ear: External ear normal.  Nose: Nose normal.  Mouth/Throat: Oropharynx is clear and moist.  Eyes: Conjunctivae and EOM are normal. Pupils are equal, round, and reactive to light.  Neck: Normal range of motion. Neck supple. Carotid bruit is not present. No thyromegaly present.  Cardiovascular: Normal rate, regular rhythm, normal heart sounds and intact distal pulses.  Exam reveals no gallop and no friction rub.   No murmur heard. Pulmonary/Chest: Effort normal and breath sounds normal. No respiratory distress. She has no wheezes. She has no rales. She exhibits no tenderness.  Abdominal: Soft. Bowel sounds are normal. She exhibits no distension and no mass. There is no tenderness. There is no rebound and no guarding.  Musculoskeletal: Normal range of motion.  Lymphadenopathy:    She has no cervical adenopathy.  Neurological: She is alert and oriented to person, place, and time. No cranial nerve deficit.  Skin: Skin is warm and dry. No rash noted. She is not diaphoretic. No erythema. No  pallor.  Psychiatric: She has a normal mood and affect. Her behavior is normal.   INFLUENZA AND TDAP ADMINISTERED.    Assessment & Plan:  Herpes labialis  Anxiety and depression  Other malaise and fatigue - Plan: POCT CBC, POCT urinalysis dipstick, Comprehensive metabolic panel, Iron, T4, free, TSH, Vitamin B12, Vit D  25 hydroxy (rtn osteoporosis monitoring), CANCELED: Hemoglobin A1c  Snoring  Recurrent infections  Screening for diabetes mellitus - Plan: Comprehensive metabolic panel, CANCELED: Hemoglobin A1c  Needs flu shot - Plan: Flu Vaccine QUAD 36+ mos IM  Need for Tdap vaccination - Plan: Tdap vaccine greater than or equal to 7yo IM   1.  Malaise and fatigue: New.  Obtain labs.  Encourage trial of Melatonin qhs for insomnia.  Insomnia likely contributing to fatigue.  Will also increase Prozac to see if anxiety/depression is contributing to fatigue.  Major work stressors currently and definitely contributing. 2.  Snoring:  Chronic. If labs normal and if no improvement with improved sleep and improved control of anxiety /depression, highly  recommend sleep study. 3.  Recurrent infections:  New for the past year; obtain labs.  Encourage regular sleep, good eating habits, regular exercise. 4.  Anxiety and depression: worsening recently; increase Fluoxetine to  daily.  Recommend regular exercise. 5.  Insomnia: uncontrolled; increase Prozac to treat anxiety/depression; add Melatonin.  Advise against Progesterone at age of 87. 6.  Screening DMII: obtain glucose. 7.  S/p TDAP and influenza vaccines.   Meds ordered this encounter  Medications  . FLUoxetine (PROZAC) 40 MG capsule    Sig: Take 2 capsules (80 mg total) by mouth daily.    Dispense:  180 capsule    Refill:  3   I personally performed the services described in this documentation, which was scribed in my presence.  The recorded information has been reviewed and is accurate.  Nilda Simmer, M.D.  [facility] [address] [place], [place]  [address] [phone] phone [phone] fax

## 2014-02-26 LAB — IRON: Iron: 105 ug/dL (ref 42–145)

## 2014-02-26 LAB — COMPREHENSIVE METABOLIC PANEL
ALBUMIN: 4.3 g/dL (ref 3.5–5.2)
ALK PHOS: 78 U/L (ref 39–117)
ALT: 13 U/L (ref 0–35)
AST: 18 U/L (ref 0–37)
BUN: 9 mg/dL (ref 6–23)
CALCIUM: 9.3 mg/dL (ref 8.4–10.5)
CHLORIDE: 102 meq/L (ref 96–112)
CO2: 27 meq/L (ref 19–32)
Creat: 0.7 mg/dL (ref 0.50–1.10)
GLUCOSE: 90 mg/dL (ref 70–99)
Potassium: 4 mEq/L (ref 3.5–5.3)
SODIUM: 139 meq/L (ref 135–145)
TOTAL PROTEIN: 6.6 g/dL (ref 6.0–8.3)
Total Bilirubin: 0.7 mg/dL (ref 0.2–1.2)

## 2014-02-26 LAB — VITAMIN B12: Vitamin B-12: 814 pg/mL (ref 211–911)

## 2014-02-26 LAB — T4, FREE: Free T4: 0.97 ng/dL (ref 0.80–1.80)

## 2014-02-26 LAB — TSH: TSH: 1.243 u[IU]/mL (ref 0.350–4.500)

## 2014-02-27 ENCOUNTER — Telehealth: Payer: Self-pay | Admitting: *Deleted

## 2014-02-27 LAB — VITAMIN D 25 HYDROXY (VIT D DEFICIENCY, FRACTURES): Vit D, 25-Hydroxy: 84 ng/mL (ref 30–89)

## 2014-02-27 NOTE — Telephone Encounter (Signed)
Pt has picked up script at [facility]. No prior auth was needed.

## 2014-02-27 NOTE — Telephone Encounter (Signed)
No need to have pt return for HgbA1c; please cancel order.

## 2014-02-27 NOTE — Telephone Encounter (Signed)
[facility] called in regards to the [facility] on this pt for 02/25/14. I apparently didn't send it, as i found in the sharps box this morning. Would you like the patient to come back in for a redraw or fingerstick so this can be done ?

## 2014-02-28 NOTE — Telephone Encounter (Signed)
Noted  

## 2014-03-01 ENCOUNTER — Encounter: Payer: Self-pay | Admitting: *Deleted

## 2014-05-01 ENCOUNTER — Other Ambulatory Visit: Payer: Self-pay | Admitting: Family Medicine

## 2014-06-18 ENCOUNTER — Emergency Department (HOSPITAL_COMMUNITY)
Admission: EM | Admit: 2014-06-18 | Discharge: 2014-06-18 | Disposition: A | Discharge status: Home / Self Care | Payer: 59 | Attending: Emergency Medicine | Admitting: Emergency Medicine

## 2014-06-18 ENCOUNTER — Encounter (HOSPITAL_COMMUNITY): Payer: Self-pay | Admitting: *Deleted

## 2014-06-18 DIAGNOSIS — Z8619 Personal history of other infectious and parasitic diseases: Secondary | ICD-10-CM | POA: Insufficient documentation

## 2014-06-18 DIAGNOSIS — H43392 Other vitreous opacities, left eye: Secondary | ICD-10-CM | POA: Diagnosis not present

## 2014-06-18 DIAGNOSIS — Z79899 Other long term (current) drug therapy: Secondary | ICD-10-CM | POA: Diagnosis not present

## 2014-06-18 DIAGNOSIS — Z880 Allergy status to penicillin: Secondary | ICD-10-CM | POA: Diagnosis not present

## 2014-06-18 DIAGNOSIS — Z8639 Personal history of other endocrine, nutritional and metabolic disease: Secondary | ICD-10-CM | POA: Diagnosis not present

## 2014-06-18 DIAGNOSIS — H578 Other specified disorders of eye and adnexa: Secondary | ICD-10-CM | POA: Diagnosis present

## 2014-06-18 DIAGNOSIS — F419 Anxiety disorder, unspecified: Secondary | ICD-10-CM | POA: Insufficient documentation

## 2014-06-18 MED ORDER — PROPARACAINE HCL 0.5 % OP SOLN
1.0000 [drp] | Freq: Once | OPHTHALMIC | Status: AC
  Administered 2014-06-18: 1 [drp] via OPHTHALMIC
  Filled 2014-06-18: qty 15

## 2014-06-18 NOTE — ED Notes (Signed)
Pt sts that she has "floaters in her L eye and flashes of light.This morning the flashes have changed to arcing." Pt denies dizziness, pain in eye, and has experienced no change in vision. Pt is A&O and in NAD. Neuro exam negative

## 2014-06-18 NOTE — ED Provider Notes (Signed)
CSN: [id number]     Arrival date & time 06/18/14  1000 History   First MD Initiated Contact with Patient 06/18/14 1053     Chief Complaint  Patient presents with  . Eye Problem     (Consider location/radiation/quality/duration/timing/severity/associated sxs/prior Treatment) HPI  Patient presents with c/o visual changes. She states that last night while watching television she began to have floaters that seemed to move from the periphery toward the nasal border. She then began seeing flashes of light in the left periphery out of her left eye. She is near sighted and wears corrective lenses. She sees an optometrist. The patient went to bed and awoke with the same disturbance. She has come for evaluation of this problem. She denies a history or injury to the eye. She denies hx diabetes. She denies any pain in the eye. Her mother has a history of spontaneous retinal detachment without risk factors for this disorder.  Past Medical History  Diagnosis Date  . Allergy   . Anxiety   . Thyroid disease   . Herpes labialis    Past Surgical History  Procedure Laterality Date  . Cesarean section    . Hernia repair     Family History  Problem Relation Age of Onset  . Osteoporosis Mother   . Hypothyroidism Mother   . Hypertension Father   . Hypothyroidism Father   . Hypothyroidism Sister   . Hypothyroidism Sister   . Hypothyroidism Sister    History  Substance Use Topics  . Smoking status: Never Smoker   . Smokeless tobacco: Never Used  . Alcohol Use: 1.8 oz/week    3 Glasses of wine per week   OB History    No data available     Review of Systems  Ten systems reviewed and are negative for acute change, except as noted in the HPI.    Allergies  Amoxicillin and Sulfamethoxazole-trimethoprim  Home Medications   Prior to Admission medications   Medication Sig Start Date End Date Taking? Authorizing Provider  ALLEGRA-D ALLERGY & CONGESTION 60-120 MG per tablet TAKE ONE TABLET  BY MOUTH TWICE DAILY 05/01/14  Yes Ethelda ChickKristi M Smith, MD  FLUoxetine (PROZAC) 40 MG capsule Take 2 capsules (80 mg total) by mouth daily. 02/25/14  Yes Ethelda ChickKristi M Smith, MD  loratadine (CLARITIN) 10 MG tablet Take 10 mg by mouth daily.   Yes Historical Provider, MD  montelukast (SINGULAIR) 10 MG tablet Take 1 tablet (10 mg total) by mouth at bedtime. Patient taking differently: Take 10 mg by mouth every morning.  09/07/13  Yes Ethelda ChickKristi M Smith, MD  Multiple Vitamin (MULTIVITAMIN WITH MINERALS) TABS tablet Take 1 tablet by mouth daily.   Yes Historical Provider, MD  acyclovir ointment (ZOVIRAX) 5 % Apply 1 application topically every 3 (three) hours. Patient not taking: Reported on 06/18/2014 12/22/13   Chelle S Jeffery, PA-C  propranolol (INDERAL) 20 MG tablet Take 2-3 tablets (40-60 mg total) by mouth once as needed. Patient not taking: Reported on 06/18/2014 12/14/13   Ethelda ChickKristi M Smith, MD  zoster vaccine live, PF, (ZOSTAVAX) [id number] UNT/0.65ML injection Inject 19,400 Units into the skin once. Patient not taking: Reported on 06/18/2014 12/14/13   Ethelda ChickKristi M Smith, MD   BP 132/85 mmHg  Pulse 70  Temp(Src) 97.8 F (36.6 C) (Oral)  Resp 16  SpO2 98% Physical Exam  Constitutional: She is oriented to person, place, and time. She appears well-developed and well-nourished. No distress.  HENT:  Head: Normocephalic and atraumatic.  Eyes:  Conjunctivae, EOM and lids are normal. Pupils are equal, round, and reactive to light. No scleral icterus.  Fundoscopic exam:      The right eye shows no exudate, no hemorrhage and no papilledema. The right eye shows red reflex.       The left eye shows no exudate, no hemorrhage and no papilledema. The left eye shows red reflex.  Visual fields full to confrontation. No visualized abnormalities on fundoscopic exam.  Neck: Normal range of motion.  Cardiovascular: Normal rate, regular rhythm and normal heart sounds.  Exam reveals no gallop and no friction rub.   No murmur  heard. Pulmonary/Chest: Effort normal and breath sounds normal. No respiratory distress.  Abdominal: Soft. Bowel sounds are normal. She exhibits no distension and no mass. There is no tenderness. There is no guarding.  Neurological: She is alert and oriented to person, place, and time.  Skin: Skin is warm and dry. She is not diaphoretic.    ED Course  Procedures (including critical care time) Labs Review Labs Reviewed - No data to display  Imaging Review No results found.   EKG Interpretation None      MDM   Final diagnoses:  None      \      06/18/14 11:42:09                   Visual Acuity   Bilateral Near  20/25 uncorrec- ted           Bilateral Distance             R Near  20/30 uncorrec- ted           R Distance             L Near  20/25 uncorrec- ted           L Distance                    12:40 PM BP 132/85 mmHg  Pulse 70  Temp(Src) 97.8 F (36.6 C) (Oral)  Resp 16  SpO2 98% Pressures measured OD 15 OS 15  Visual acuity as above, I have discussed the case with Dr. Astrid Drafts who is on call for EYE today. He asks that the patient come to his practice tomorrow morning at 9:00 am. He asks that she sleep on her R sided. No signs of glaucoma ? Vitroeus/ retinal detachment. Appears safe for discharge at this time.   Arthor Captain, PA-C 06/21/14 1616  Arby Barrette, MD 06/23/14 [phone]

## 2014-06-18 NOTE — ED Notes (Signed)
Pt reports floaters and "flashes of light" in L eye. No pain. Denies problems with R eye. Denies recent injury. Sudden onset last night 9pm.

## 2014-06-18 NOTE — Discharge Instructions (Signed)
Please make sure to sleep on your right side. Do not lift any heavy objects or strain. Follow up as directed. Eye Floaters A jelly-like fluid fills the inside of the eye and is called the vitreous. The vitreous is normally clear. It allows light to pass through to the back of the eye to the tissues that contain the nerves needed for vision (the retina). With age, the vitreous can start to decline. If a decline happens, specks of material from clumps of cells, blood, or other materials may start to float around inside the eye. These objects cast shadows on the retina. These shadows are seen as moving strings, streaks, "bugs," dust or spider webs floating in front of the eye. CAUSES   Age.  A high degree of near-sightedness (high myopia).  Tears in the retina.  Bleeding inside the eye from broken retinal blood vessels as a result of disease (diabetes, inflammation of the retinal blood vessels, and others).  Blood clot of the major vein of the retina or its branches (retinal vein occlusion).  Trauma.  Retinal detachment.  Vitreous detachment.  Eye surgery.  Inflammation inside the eye (uveitis).  Infection inside the eye. SYMPTOMS   Seeing floating specs, dots or spider webs in the vision of one eye. This can sometimes be associated with flashes of light seen off to the side.  Bleeding in the eye may begin as floaters and lead to complete vision loss as the vitreous fills with blood. This may happen repeatedly in certain diseases of the blood vessels of the retina (e.g. diabetes).  If the vitreous shrinks enough to pull away from the retina (posterior vitreous detachment), a small circular ring-shaped floater may be seen. Migraine headaches may be associated with many forms of visual symptoms (sparkling dots, wavy lines) just before the headache strikes. These symptoms due to migraine are not from floaters. They will disappear when the headache goes away. DIAGNOSIS  An eye  professional can tell you if you have floaters during an eye exam. TREATMENT  There is no treatment for the floaters themselves.  If the floaters are due to a tear in the retina, a retinal detachment or other eye disease, the condition that caused the floaters must be treated.  Floaters due to blood in the eye often go away or lessen with time. SEEK MEDICAL CARE IF:   You suddenly see floating dots or spider webs in front of the vision of one or both eyes. This is especially true if you also see flashes of light off to the side (like flashes of lightening).  You see floaters and also notice a change or drop in your vision in either eye. Document Released: 05/29/2003 Document Revised: 08/18/2011 Document Reviewed: 09/23/2007 [facility] Patient Information 2015 [place], [place]. This information is not intended to replace advice given to you by your health care provider. Make sure you discuss any questions you have with your health care provider.  Vitreous Detachment Most of the eye's interior is filled with vitreous. This is a clear, gel-like substance that helps the eye maintain a round shape. There are millions of fine fibers intertwined within the vitreous that are attached to the surface of the retina (eye's light-sensitive tissue). As we age, the vitreous slowly shrinks, and these fine fibers pull on the retinal surface. Usually the fibers break, allowing the vitreous to separate and shrink away from the retina. This is called a vitreous detachment. In most cases, a vitreous detachment is not sight-threatening. It requires no  treatment. A vitreous detachment is a common condition that usually affects people over age 40. It is even more common after age 72. People who are nearsighted are also at increased risk. Those who have a vitreous detachment in one eye are likely to have one in the other, but it may not happen until years later. SYMPTOMS  As the vitreous shrinks, it becomes somewhat stringy.  These strands can cast tiny shadows on the retina. They are seen as "cobwebs" or specks that seem to float about in your field of vision (floaters). If you try to look at these shadows, they appear to quickly dart out of the way. One symptom of a vitreous detachment is a small, sudden increase in the number of new floaters. This increase in floaters may be accompanied by flashes of light (lightning streaks) in your side (peripheral) vision. In most cases, either you will not notice a vitreous detachment, or you will find it merely annoying due to the increase in floaters. If the vitreous pulls away from the very back of the eye where it is attached to the optic nerve, you may see a small round circle floating within your visual field. This is from the ring of fiber that held the vitreous to the edges of the optic nerve, but has been pulled away. TREATMENT  A vitreous detachment itself does not threaten sight. Occasionally, however, some of the vitreous fibers pull so hard on the retina that they create a hole or tear in the retina. This may lead to a serious condition called a retinal detachment. If the hole occurs at the very back of the eye where all of the light for detailed vision is focused (macula), the ability to see detail clearly may be lost. Both of these conditions are sight-threatening and should be evaluated immediately.  DIAGNOSIS  If the vitreous detachment has led to a retinal tear, hole, or a detached retina, early treatment can help prevent loss of vision. However, macular holes may not be treatable. If left untreated, a detached retina can lead to permanent vision loss in the affected eye. Those who experience a sudden increase in floaters or an increase in flashes of light in peripheral vision should have an eye care specialist examine their eyes as soon as possible. If a portion of your visual field in one eye is completely black, like a curtain, you may have had a retinal detachment. The  only way to diagnose the cause of the problem is to have a complete dilated eye exam (medicines are put in the eye to make the black circle, the pupil, larger) by an eye specialist. Document Released: 05/29/2003 Document Revised: 08/18/2011 Document Reviewed: 09/21/2013 [facility] Patient Information 2015 [place], [place]. This information is not intended to replace advice given to you by your health care provider. Make sure you discuss any questions you have with your health care provider.

## 2014-06-19 ENCOUNTER — Ambulatory Visit: Payer: Self-pay | Admitting: Family Medicine

## 2014-09-15 ENCOUNTER — Ambulatory Visit: Payer: 59 | Admitting: Family Medicine

## 2014-10-30 ENCOUNTER — Telehealth: Payer: Self-pay | Admitting: *Deleted

## 2014-10-30 NOTE — Telephone Encounter (Signed)
Left message for pt to call back.  Need to know if pt had mammogram, what date, and where

## 2014-11-03 ENCOUNTER — Other Ambulatory Visit: Payer: Self-pay

## 2014-11-03 ENCOUNTER — Other Ambulatory Visit: Payer: Self-pay | Admitting: Internal Medicine

## 2014-11-03 DIAGNOSIS — Z1231 Encounter for screening mammogram for malignant neoplasm of breast: Secondary | ICD-10-CM

## 2014-11-08 ENCOUNTER — Ambulatory Visit: Payer: 59

## 2015-06-11 ENCOUNTER — Encounter: Payer: Self-pay | Admitting: Family Medicine

## 2015-06-11 ENCOUNTER — Ambulatory Visit (INDEPENDENT_AMBULATORY_CARE_PROVIDER_SITE_OTHER): Payer: BLUE CROSS/BLUE SHIELD | Admitting: Family Medicine

## 2015-06-11 VITALS — BP 132/84 | HR 77 | Temp 98.0°F | Resp 14 | Ht 67.5 in | Wt 147.0 lb

## 2015-06-11 DIAGNOSIS — B001 Herpesviral vesicular dermatitis: Secondary | ICD-10-CM

## 2015-06-11 DIAGNOSIS — F32A Depression, unspecified: Secondary | ICD-10-CM

## 2015-06-11 DIAGNOSIS — F418 Other specified anxiety disorders: Secondary | ICD-10-CM | POA: Diagnosis not present

## 2015-06-11 DIAGNOSIS — F419 Anxiety disorder, unspecified: Principal | ICD-10-CM

## 2015-06-11 DIAGNOSIS — Z23 Encounter for immunization: Secondary | ICD-10-CM | POA: Diagnosis not present

## 2015-06-11 DIAGNOSIS — F40248 Other situational type phobia: Secondary | ICD-10-CM

## 2015-06-11 DIAGNOSIS — Z131 Encounter for screening for diabetes mellitus: Secondary | ICD-10-CM

## 2015-06-11 DIAGNOSIS — J3089 Other allergic rhinitis: Secondary | ICD-10-CM | POA: Diagnosis not present

## 2015-06-11 DIAGNOSIS — F329 Major depressive disorder, single episode, unspecified: Secondary | ICD-10-CM

## 2015-06-11 DIAGNOSIS — E038 Other specified hypothyroidism: Secondary | ICD-10-CM

## 2015-06-11 DIAGNOSIS — E039 Hypothyroidism, unspecified: Secondary | ICD-10-CM

## 2015-06-11 LAB — CBC WITH DIFFERENTIAL/PLATELET
BASOS ABS: 0.1 10*3/uL (ref 0.0–0.1)
Basophils Relative: 1 % (ref 0–1)
EOS ABS: 0.1 10*3/uL (ref 0.0–0.7)
EOS PCT: 1 % (ref 0–5)
HCT: 39.3 % (ref 36.0–46.0)
HEMOGLOBIN: 13.1 g/dL (ref 12.0–15.0)
Lymphocytes Relative: 27 % (ref 12–46)
Lymphs Abs: 1.6 10*3/uL (ref 0.7–4.0)
MCH: 27.9 pg (ref 26.0–34.0)
MCHC: 33.3 g/dL (ref 30.0–36.0)
MCV: 83.8 fL (ref 78.0–100.0)
MPV: 9.5 fL (ref 8.6–12.4)
Monocytes Absolute: 0.5 10*3/uL (ref 0.1–1.0)
Monocytes Relative: 9 % (ref 3–12)
Neutro Abs: 3.7 10*3/uL (ref 1.7–7.7)
Neutrophils Relative %: 62 % (ref 43–77)
Platelets: 286 10*3/uL (ref 150–400)
RBC: 4.69 MIL/uL (ref 3.87–5.11)
RDW: 13.3 % (ref 11.5–15.5)
WBC: 5.9 10*3/uL (ref 4.0–10.5)

## 2015-06-11 LAB — COMPREHENSIVE METABOLIC PANEL
ALK PHOS: 99 U/L (ref 33–130)
ALT: 16 U/L (ref 6–29)
AST: 20 U/L (ref 10–35)
Albumin: 4.2 g/dL (ref 3.6–5.1)
BILIRUBIN TOTAL: 0.8 mg/dL (ref 0.2–1.2)
BUN: 10 mg/dL (ref 7–25)
CO2: 26 mmol/L (ref 20–31)
CREATININE: 0.62 mg/dL (ref 0.50–0.99)
Calcium: 9.4 mg/dL (ref 8.6–10.4)
Chloride: 101 mmol/L (ref 98–110)
GLUCOSE: 93 mg/dL (ref 65–99)
Potassium: 4 mmol/L (ref 3.5–5.3)
SODIUM: 137 mmol/L (ref 135–146)
Total Protein: 6.4 g/dL (ref 6.1–8.1)

## 2015-06-11 LAB — TSH: TSH: 2.836 u[IU]/mL (ref 0.350–4.500)

## 2015-06-11 LAB — T4, FREE: Free T4: 0.91 ng/dL (ref 0.80–1.80)

## 2015-06-11 MED ORDER — FLUOXETINE HCL 40 MG PO CAPS: 40.0000 mg | ORAL_CAPSULE | Freq: Every day | ORAL | Status: DC

## 2015-06-11 MED ORDER — THYROID 65 MG PO TABS: 65.0000 mg | ORAL_TABLET | Freq: Every day | ORAL | Status: DC

## 2015-06-11 MED ORDER — ACYCLOVIR 5 % EX OINT: 1.0000 "application " | TOPICAL_OINTMENT | CUTANEOUS | Status: DC

## 2015-06-11 MED ORDER — PROPRANOLOL HCL 20 MG PO TABS: 40.0000 mg | ORAL_TABLET | Freq: Once | ORAL | Status: DC | PRN

## 2015-06-11 MED ORDER — MONTELUKAST SODIUM 10 MG PO TABS: 10.0000 mg | ORAL_TABLET | Freq: Every day | ORAL | Status: DC

## 2015-06-11 MED ORDER — FLUTICASONE PROPIONATE 50 MCG/ACT NA SUSP: 2.0000 | Freq: Every day | NASAL | Status: DC

## 2015-06-11 NOTE — Progress Notes (Signed)
Subjective:    Patient ID: Shannon Adkins, female    DOB: [birth year], 65 y.o.   MRN: [id number]  06/11/2015  Medication Refill   HPI This 65 y.o. female presents for eighteen month follow-up:  1. Anxiety and depression:  Doing really well. Decreased to Prozac 60mg  daily in the past year; now interested in decreasing to 40mg  daily. Transitioning from Citalopram to Fluoxetine was very beneficial to patient emotionally.  Now able to be self motivated and assertive with husband.  Feeling much better.  No SI.  Sleeping well.    2.  Allergic Rhinitis:  Patient reports good compliance with medication, good tolerance to medication, and good symptom control.  Needs refill of Singulair.   Taking Allegra D and Claritin; using Flonase daily.    3.  Social anxiety:  Requesting refill of Propanolol for singing in church.   4.  HSV labialis: needs refill Zovirax ointment; gets outbreaks every two weeks.  5.  Subclinical hypothyroidism:  Requesting refill of Naturethroid; last TSH WNL and advised not to continue; however, patient feels much better on small amount of supplement; hair grows better; skin feels better.   Review of Systems  Constitutional: Negative for fever, chills, diaphoresis and fatigue.  HENT: Negative for congestion, postnasal drip, rhinorrhea and sore throat.   Eyes: Negative for visual disturbance.  Respiratory: Negative for cough and shortness of breath.   Cardiovascular: Negative for chest pain, palpitations and leg swelling.  Gastrointestinal: Negative for nausea, vomiting, abdominal pain, diarrhea and constipation.  Endocrine: Negative for cold intolerance, heat intolerance, polydipsia, polyphagia and polyuria.  Neurological: Negative for dizziness, tremors, seizures, syncope, facial asymmetry, speech difficulty, weakness, light-headedness, numbness and headaches.  Psychiatric/Behavioral: Positive for dysphoric mood. Negative for suicidal ideas, sleep disturbance and  self-injury. The patient is nervous/anxious.     Past Medical History  Diagnosis Date  . Allergy   . Anxiety   . Thyroid disease   . Herpes labialis    Past Surgical History  Procedure Laterality Date  . Cesarean section    . Hernia repair     Allergies  Allergen Reactions  . Amoxicillin Hives    Pt called in to report on 07/31/11  . Sulfamethoxazole-Trimethoprim     REACTION: conjunctivitis    Social History   Social History  . Marital Status: Married    Spouse Name: N/A  . Number of Children: 3  . Years of Education: N/A   Occupational History  . Not on file.   Social History Main Topics  . Smoking status: Never Smoker   . Smokeless tobacco: Never Used  . Alcohol Use: 1.8 oz/week    3 Glasses of wine per week  . Drug Use: No  . Sexual Activity: Yes   Other Topics Concern  . Not on file   Social History Narrative   Family History  Problem Relation Age of Onset  . Osteoporosis Mother   . Hypothyroidism Mother   . Hypertension Father   . Hypothyroidism Father   . Hypothyroidism Sister   . Hypothyroidism Sister   . Hypothyroidism Sister        Objective:    BP 132/84 mmHg  Pulse 77  Temp(Src) 98 F (36.7 C)  Resp 14  Ht 5' 7.5" (1.715 m)  Wt 147 lb (66.679 kg)  BMI 22.67 kg/m2  SpO2 98% Physical Exam  Constitutional: She is oriented to person, place, and time. She appears well-developed and well-nourished. No distress.  HENT:  Head: Normocephalic and  atraumatic.  Right Ear: External ear normal.  Left Ear: External ear normal.  Nose: Nose normal.  Mouth/Throat: Oropharynx is clear and moist.  Eyes: Conjunctivae and EOM are normal. Pupils are equal, round, and reactive to light.  Neck: Normal range of motion. Neck supple. Carotid bruit is not present. No thyromegaly present.  Cardiovascular: Normal rate, regular rhythm, normal heart sounds and intact distal pulses.  Exam reveals no gallop and no friction rub.   No murmur  heard. Pulmonary/Chest: Effort normal and breath sounds normal. She has no wheezes. She has no rales.  Abdominal: Soft. Bowel sounds are normal. She exhibits no distension and no mass. There is no tenderness. There is no rebound and no guarding.  Lymphadenopathy:    She has no cervical adenopathy.  Neurological: She is alert and oriented to person, place, and time. No cranial nerve deficit.  Skin: Skin is warm and dry. No rash noted. She is not diaphoretic. No erythema. No pallor.  Psychiatric: She has a normal mood and affect. Her behavior is normal.        Assessment & Plan:   1. Anxiety and depression   2. Other allergic rhinitis   3. Herpes labialis   4. Fear of public speaking   5. Subclinical hypothyroidism   6. Screening for diabetes mellitus   7. Need for prophylactic vaccination and inoculation against influenza     1. Anxiety and depression: improving; decrease Prozac to 40mg  daily; RTC six months. 2.  Allergic Rhinitis:  Stable; refill of Singulair provided. 3.  HSV labialis: refill provided. 4.  Fear of public speaking: stable; refill of Propanolol. 5.  Subclinical hypothyroidism:  Stable; obtain labs; rx for supplement provided. 6.  Screening DMII: obtain glucose, HgbA1c. 7.  S/p flu vaccine.   Orders Placed This Encounter  Procedures  . Flu Vaccine QUAD 36+ mos IM  . CBC with Differential/Platelet  . Comprehensive metabolic panel  . TSH  . T4, free   Meds ordered this encounter  Medications  . fluticasone (VERAMYST) 27.5 MCG/SPRAY nasal spray    Sig: Place 2 sprays into the nose daily.  . Ascorbic Acid (VITAMIN C) 1000 MG tablet    Sig: Take 1,000 mg by mouth daily.  Marland Kitchen b complex vitamins tablet    Sig: Take 1 tablet by mouth daily.  Marland Kitchen Lysine 500 MG CAPS    Sig: Take by mouth.  Marland Kitchen FLUoxetine (PROZAC) 40 MG capsule    Sig: Take 1 capsule (40 mg total) by mouth daily.    Dispense:  90 capsule    Refill:  3  . acyclovir ointment (ZOVIRAX) 5 %    Sig:  Apply 1 application topically every 3 (three) hours.    Dispense:  15 g    Refill:  5  . montelukast (SINGULAIR) 10 MG tablet    Sig: Take 1 tablet (10 mg total) by mouth at bedtime.    Dispense:  90 tablet    Refill:  3  . fluticasone (FLONASE) 50 MCG/ACT nasal spray    Sig: Place 2 sprays into both nostrils daily.    Dispense:  16 g    Refill:  6  . thyroid (ARMOUR) 65 MG tablet    Sig: Take 1 tablet (65 mg total) by mouth daily.    Dispense:  90 tablet    Refill:  3  . propranolol (INDERAL) 20 MG tablet    Sig: Take 2-3 tablets (40-60 mg total) by mouth once as needed.  Dispense:  30 tablet    Refill:  3    Return in about 6 months (around 12/09/2015) for complete physical examiniation.    Romari Gasparro Paulita FujitaMartin Jadarian Mckay, M.D. [facility]  [place] [address] [place], [place]  [id number] [phone] phone [phone] fax

## 2015-06-20 ENCOUNTER — Encounter: Payer: Self-pay | Admitting: Family Medicine

## 2015-06-29 ENCOUNTER — Telehealth: Payer: Self-pay

## 2015-06-29 NOTE — Telephone Encounter (Signed)
[facility] pharm faxed stating that pt is changing Rxs to their pharm and they want permission to fill pt's thyroid med w/ Ashby Dawes Thyroid 65 mg (looks like pt has been on Armour Thyroid. Please advise. I will pend Rx.

## 2015-07-03 MED ORDER — THYROID 30 MG PO TABS: 30.0000 mg | ORAL_TABLET | Freq: Every day | ORAL | Status: DC

## 2015-07-03 NOTE — Telephone Encounter (Signed)
Call --- I thought patient told me at her recent visit that she had not been on any thyroid supplement for the past year and she was interested in restarting the medication.  Please call the pharmacy to verify if patient has been receiving thyroid supplementation for the past year.

## 2015-07-03 NOTE — Telephone Encounter (Signed)
Yes.  Patient did request  of Nature Thyroid at recent visit.  Please call patient and advise her that  it too high of a dose to start with; I have sent in a rx for  instead to start with.

## 2015-07-03 NOTE — Telephone Encounter (Signed)
Spoke to Shannon Adkins who reported that she has been consistently taking the [facility] Thyroid 65 mg. Dr Katrinka Blazing, according to your OV notes and your message below, it appears that you were under the impression that the Shannon Adkins had been taken off of the medication and had not been taking it. Do you want to keep Shannon Adkins at the 65 mg she has been taking, after all, or still reduce the dose? I will pend again at 65 mg, but if you want to reduce, the pharm stated that the Ashby Dawes Thy comes in 32.5 mg instead of 30 mg.

## 2015-07-04 NOTE — Telephone Encounter (Signed)
Called current pharm and they have not filled anything for pt before this month. Called [facility] where it looks like pt had gotten some Rxs previously and they DID report that the pt has gotten the Paden thyroid filled there the last two months at a dose of 65 mg, so it looks like the pt has been taking it.

## 2015-07-06 MED ORDER — THYROID 65 MG PO TABS: 65.0000 mg | ORAL_TABLET | Freq: Every day | ORAL | Status: DC

## 2015-07-06 NOTE — Telephone Encounter (Signed)
Nature thyroid  approved.

## 2015-10-18 ENCOUNTER — Encounter: Payer: Self-pay | Admitting: Family Medicine

## 2015-10-22 MED ORDER — THYROID 65 MG PO TABS: 65.0000 mg | ORAL_TABLET | Freq: Every day | ORAL | Status: DC

## 2015-11-13 ENCOUNTER — Emergency Department (HOSPITAL_COMMUNITY): Payer: No Typology Code available for payment source

## 2015-11-13 ENCOUNTER — Encounter (HOSPITAL_COMMUNITY): Payer: Self-pay | Admitting: Emergency Medicine

## 2015-11-13 ENCOUNTER — Emergency Department (HOSPITAL_COMMUNITY)
Admission: EM | Admit: 2015-11-13 | Discharge: 2015-11-13 | Disposition: A | Discharge status: Home / Self Care | Payer: No Typology Code available for payment source | Attending: Emergency Medicine | Admitting: Emergency Medicine

## 2015-11-13 DIAGNOSIS — Y92410 Unspecified street and highway as the place of occurrence of the external cause: Secondary | ICD-10-CM | POA: Diagnosis not present

## 2015-11-13 DIAGNOSIS — Y939 Activity, unspecified: Secondary | ICD-10-CM | POA: Insufficient documentation

## 2015-11-13 DIAGNOSIS — M25462 Effusion, left knee: Secondary | ICD-10-CM | POA: Insufficient documentation

## 2015-11-13 DIAGNOSIS — S20212A Contusion of left front wall of thorax, initial encounter: Secondary | ICD-10-CM | POA: Insufficient documentation

## 2015-11-13 DIAGNOSIS — Y999 Unspecified external cause status: Secondary | ICD-10-CM | POA: Insufficient documentation

## 2015-11-13 DIAGNOSIS — S20302A Unspecified superficial injuries of left front wall of thorax, initial encounter: Secondary | ICD-10-CM | POA: Diagnosis present

## 2015-11-13 MED ORDER — HYDROCODONE-ACETAMINOPHEN 5-325 MG PO TABS
1.0000 | ORAL_TABLET | Freq: Once | ORAL | Status: AC
  Administered 2015-11-13: 1 via ORAL
  Filled 2015-11-13: qty 1

## 2015-11-13 MED ORDER — HYDROCODONE-ACETAMINOPHEN 5-325 MG PO TABS: ORAL_TABLET | ORAL | Status: DC
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
## 2015-11-13 NOTE — Discharge Instructions (Signed)
For pain control please take ibuprofen (also known as Motrin or Advil) 800mg  (this is normally 4 over the counter pills) 3 times a day  for 5 days. Take with food to minimize stomach irritation.  Take vicodin for breakthrough pain, do not drink alcohol, drive, care for children or do other critical tasks while taking vicodin.  It is very important that you take deep breaths to prevent lung collapse and infection.  Take 10 deep breaths every hour to prevent lung collapse.  If you develop cough, fever or shortness of breath return immediately to the emergency room.   Please follow with your primary care doctor in the next 2 days for a check-up. They must obtain records for further management.   Do not hesitate to return to the Emergency Department for any new, worsening or concerning symptoms.   Chest Contusion A chest contusion is a deep bruise on your chest area. Contusions are the result of an injury that caused bleeding under the skin. A chest contusion may involve bruising of the skin, muscles, or ribs. The contusion may turn blue, purple, or yellow. Minor injuries will give you a painless contusion, but more severe contusions may stay painful and swollen for a few weeks. CAUSES  A contusion is usually caused by a blow, trauma, or direct force to an area of the body. SYMPTOMS   Swelling and redness of the injured area.  Discoloration of the injured area.  Tenderness and soreness of the injured area.  Pain. DIAGNOSIS  The diagnosis can be made by taking a history and performing a physical exam. An X-ray, CT scan, or MRI may be needed to determine if there were any associated injuries, such as broken bones (fractures) or internal injuries. TREATMENT  Often, the best treatment for a chest contusion is resting, icing, and applying cold compresses to the injured area. Deep breathing exercises may be recommended to reduce the risk of pneumonia. Over-the-counter medicines may also be  recommended for pain control. HOME CARE INSTRUCTIONS   Put ice on the injured area.  Put ice in a plastic bag.  Place a towel between your skin and the bag.  Leave the ice on for 15-20 minutes, 03-04 times a day.  Only take over-the-counter or prescription medicines as directed by your caregiver. Your caregiver may recommend avoiding anti-inflammatory medicines (aspirin, ibuprofen, and naproxen) for 48 hours because these medicines may increase bruising.  Rest the injured area.  Perform deep-breathing exercises as directed by your caregiver.  Stop smoking if you smoke.  Do not lift objects over 5 pounds (2.3 kg) for 3 days or longer if recommended by your caregiver. SEEK IMMEDIATE MEDICAL CARE IF:   You have increased bruising or swelling.  You have pain that is getting worse.  You have difficulty breathing.  You have dizziness, weakness, or fainting.  You have blood in your urine or stool.  You cough up or vomit blood.  Your swelling or pain is not relieved with medicines. MAKE SURE YOU:   Understand these instructions.  Will watch your condition.  Will get help right away if you are not doing well or get worse.   This information is not intended to replace advice given to you by your health care provider. Make sure you discuss any questions you have with your health care provider.   Document Released: 02/18/2001 Document Revised: 02/18/2012 Document Reviewed: 11/17/2011 Elsevier Interactive Patient Education [employer]2016 [employer] Inc.
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
## 2015-11-21 ENCOUNTER — Ambulatory Visit (INDEPENDENT_AMBULATORY_CARE_PROVIDER_SITE_OTHER): Payer: BLUE CROSS/BLUE SHIELD

## 2015-11-21 ENCOUNTER — Ambulatory Visit (INDEPENDENT_AMBULATORY_CARE_PROVIDER_SITE_OTHER): Payer: BLUE CROSS/BLUE SHIELD | Admitting: Family Medicine

## 2015-11-21 VITALS — BP 118/62 | HR 78 | Temp 97.8°F | Resp 16 | Ht 67.0 in | Wt 149.4 lb

## 2015-11-21 DIAGNOSIS — M25562 Pain in left knee: Secondary | ICD-10-CM

## 2015-11-21 DIAGNOSIS — S20219A Contusion of unspecified front wall of thorax, initial encounter: Secondary | ICD-10-CM | POA: Diagnosis not present

## 2015-11-21 DIAGNOSIS — M25462 Effusion, left knee: Secondary | ICD-10-CM

## 2015-11-21 DIAGNOSIS — R0789 Other chest pain: Secondary | ICD-10-CM
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
## 2015-11-23 ENCOUNTER — Telehealth: Payer: Self-pay | Admitting: Family Medicine

## 2015-11-23 NOTE — Telephone Encounter (Signed)
Patient is returning Dr. Paralee CancelGreene's phone call.

## 2015-11-23 NOTE — Telephone Encounter (Signed)
FYI
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
## 2015-12-14 ENCOUNTER — Encounter: Payer: BLUE CROSS/BLUE SHIELD | Admitting: Family Medicine

## 2016-01-08 ENCOUNTER — Encounter: Payer: Self-pay | Admitting: Family Medicine

## 2016-01-08 ENCOUNTER — Ambulatory Visit (INDEPENDENT_AMBULATORY_CARE_PROVIDER_SITE_OTHER): Payer: BLUE CROSS/BLUE SHIELD | Admitting: Family Medicine

## 2016-01-08 VITALS — BP 118/80 | HR 75 | Temp 98.1°F | Resp 16 | Ht 67.5 in | Wt 144.2 lb

## 2016-01-08 DIAGNOSIS — K219 Gastro-esophageal reflux disease without esophagitis: Secondary | ICD-10-CM

## 2016-01-08 DIAGNOSIS — F329 Major depressive disorder, single episode, unspecified: Secondary | ICD-10-CM

## 2016-01-08 DIAGNOSIS — K589 Irritable bowel syndrome without diarrhea: Secondary | ICD-10-CM | POA: Diagnosis not present

## 2016-01-08 DIAGNOSIS — Z0000 Encounter for general adult medical examination without abnormal findings: Secondary | ICD-10-CM | POA: Diagnosis not present

## 2016-01-08 DIAGNOSIS — Z13220 Encounter for screening for lipoid disorders: Secondary | ICD-10-CM | POA: Diagnosis not present

## 2016-01-08 DIAGNOSIS — E038 Other specified hypothyroidism: Secondary | ICD-10-CM

## 2016-01-08 DIAGNOSIS — Z131 Encounter for screening for diabetes mellitus: Secondary | ICD-10-CM

## 2016-01-08 DIAGNOSIS — F418 Other specified anxiety disorders: Secondary | ICD-10-CM | POA: Diagnosis not present

## 2016-01-08 DIAGNOSIS — Z1231 Encounter for screening mammogram for malignant neoplasm of breast: Secondary | ICD-10-CM | POA: Diagnosis not present

## 2016-01-08 DIAGNOSIS — F419 Anxiety disorder, unspecified: Secondary | ICD-10-CM

## 2016-01-08 DIAGNOSIS — B001 Herpesviral vesicular dermatitis: Secondary | ICD-10-CM

## 2016-01-08 DIAGNOSIS — R5382 Chronic fatigue, unspecified: Secondary | ICD-10-CM | POA: Diagnosis not present

## 2016-01-08 DIAGNOSIS — Z114 Encounter for screening for human immunodeficiency virus [HIV]: Secondary | ICD-10-CM

## 2016-01-08 DIAGNOSIS — J3089 Other allergic rhinitis: Secondary | ICD-10-CM

## 2016-01-08 DIAGNOSIS — Z124 Encounter for screening for malignant neoplasm of cervix: Secondary | ICD-10-CM | POA: Diagnosis not present

## 2016-01-08 DIAGNOSIS — Z136 Encounter for screening for cardiovascular disorders: Secondary | ICD-10-CM | POA: Diagnosis not present

## 2016-01-08 DIAGNOSIS — F32A Depression, unspecified: Secondary | ICD-10-CM

## 2016-01-08 DIAGNOSIS — E039 Hypothyroidism, unspecified: Secondary | ICD-10-CM

## 2016-01-08 DIAGNOSIS — F40248 Other situational type phobia: Secondary | ICD-10-CM

## 2016-01-08 DIAGNOSIS — B999 Unspecified infectious disease: Secondary | ICD-10-CM | POA: Diagnosis not present

## 2016-01-08 DIAGNOSIS — F4010 Social phobia, unspecified: Secondary | ICD-10-CM

## 2016-01-08 DIAGNOSIS — Z1159 Encounter for screening for other viral diseases: Secondary | ICD-10-CM | POA: Diagnosis not present

## 2016-01-08 LAB — CBC WITH DIFFERENTIAL/PLATELET
Basophils Absolute: 43 cells/uL (ref 0–200)
Basophils Relative: 1 %
EOS PCT: 2 %
Eosinophils Absolute: 86 cells/uL (ref 15–500)
HCT: 40.1 % (ref 35.0–45.0)
HEMOGLOBIN: 13.2 g/dL (ref 11.7–15.5)
LYMPHS ABS: 860 {cells}/uL (ref 850–3900)
Lymphocytes Relative: 20 %
MCH: 27.9 pg (ref 27.0–33.0)
MCHC: 32.9 g/dL (ref 32.0–36.0)
MCV: 84.8 fL (ref 80.0–100.0)
MONOS PCT: 8 %
MPV: 9.9 fL (ref 7.5–12.5)
Monocytes Absolute: 344 cells/uL (ref 200–950)
NEUTROS ABS: 2967 {cells}/uL (ref 1500–7800)
NEUTROS PCT: 69 %
PLATELETS: 274 10*3/uL (ref 140–400)
RBC: 4.73 MIL/uL (ref 3.80–5.10)
RDW: 13 % (ref 11.0–15.0)
WBC: 4.3 10*3/uL (ref 3.8–10.8)

## 2016-01-08 LAB — POCT URINALYSIS DIP (MANUAL ENTRY)
Bilirubin, UA: NEGATIVE
Blood, UA: NEGATIVE
GLUCOSE UA: NEGATIVE
Leukocytes, UA: NEGATIVE
Nitrite, UA: NEGATIVE
PROTEIN UA: NEGATIVE
Spec Grav, UA: 1.02
Urobilinogen, UA: 1
pH, UA: 7

## 2016-01-08 LAB — COMPREHENSIVE METABOLIC PANEL
ALBUMIN: 4.2 g/dL (ref 3.6–5.1)
ALT: 13 U/L (ref 6–29)
AST: 19 U/L (ref 10–35)
Alkaline Phosphatase: 90 U/L (ref 33–130)
BILIRUBIN TOTAL: 0.6 mg/dL (ref 0.2–1.2)
BUN: 8 mg/dL (ref 7–25)
CO2: 25 mmol/L (ref 20–31)
CREATININE: 0.77 mg/dL (ref 0.50–0.99)
Calcium: 9 mg/dL (ref 8.6–10.4)
Chloride: 106 mmol/L (ref 98–110)
GLUCOSE: 88 mg/dL (ref 65–99)
Potassium: 4.1 mmol/L (ref 3.5–5.3)
SODIUM: 140 mmol/L (ref 135–146)
Total Protein: 6.2 g/dL (ref 6.1–8.1)

## 2016-01-08 LAB — HEPATITIS C ANTIBODY: HCV Ab: NEGATIVE

## 2016-01-08 LAB — LIPID PANEL
Cholesterol: 220 mg/dL — ABNORMAL HIGH (ref 125–200)
HDL: 80 mg/dL (ref >=46)
LDL CALC: 117 mg/dL (ref <130)
TRIGLYCERIDES: 113 mg/dL (ref <150)
Total CHOL/HDL Ratio: 2.8 Ratio (ref <=5.0)
VLDL: 23 mg/dL (ref <30)

## 2016-01-08 LAB — POC MICROSCOPIC URINALYSIS (UMFC)

## 2016-01-08 LAB — HEMOGLOBIN A1C
HEMOGLOBIN A1C: 5.4 % (ref <5.7)
MEAN PLASMA GLUCOSE: 108 mg/dL

## 2016-01-08 LAB — TSH: TSH: 2.08 m[IU]/L

## 2016-01-08 LAB — T4, FREE: FREE T4: 1 ng/dL (ref 0.8–1.8)

## 2016-01-08 LAB — HIV ANTIBODY (ROUTINE TESTING W REFLEX): HIV 1&2 Ab, 4th Generation: NONREACTIVE

## 2016-01-08 MED ORDER — MONTELUKAST SODIUM 10 MG PO TABS: 10.0000 mg | ORAL_TABLET | Freq: Every day | ORAL | 3 refills | Status: DC

## 2016-01-08 MED ORDER — ZOSTER VACCINE LIVE 19400 UNT/0.65ML ~~LOC~~ SUSR: 0.6500 mL | Freq: Once | SUBCUTANEOUS | 0 refills | Status: AC

## 2016-01-08 MED ORDER — FLUOXETINE HCL 40 MG PO CAPS: 80.0000 mg | ORAL_CAPSULE | Freq: Every day | ORAL | 3 refills | Status: DC

## 2016-01-08 MED ORDER — FLUTICASONE PROPIONATE 50 MCG/ACT NA SUSP: 2.0000 | Freq: Every day | NASAL | 11 refills | Status: DC

## 2016-01-08 MED ORDER — PROPRANOLOL HCL 20 MG PO TABS: 40.0000 mg | ORAL_TABLET | Freq: Once | ORAL | 3 refills | Status: DC | PRN

## 2016-01-08 NOTE — Progress Notes (Signed)
Subjective:    Patient ID: Shannon Adkins, female    DOB: [birth year], 65 y.o.   MRN: [id number]  HPI This 65 y.o. female presents for Complete Physical Examination.   Last physical:  12/2014 Pap smear:  Several years ago.  Ten years ago. Mammogram:  Overdue; last mammogram ten years ago.  Colonoscopy: 2016 Buccini; WNL.  Repeat in 10 years. Bone density: never TDAP:  2015 Pneumovax: never Zostavax:  never Influenza:  06/2015 Eye exam:  2016; one contact Dental exam:  No dental care regularly.   Anxiety: increased Prozac to two daily.    Recurrent infections: gets sick every week with fatigue, congestion.   Review of Systems  Constitutional: Positive for fatigue. Negative for activity change, appetite change, chills, diaphoresis, fever and unexpected weight change.  HENT: Negative.  Negative for congestion, dental problem, drooling, ear discharge, ear pain, facial swelling, hearing loss, mouth sores, nosebleeds, postnasal drip, rhinorrhea, sinus pressure, sneezing, sore throat, tinnitus, trouble swallowing and voice change.   Eyes: Negative.  Negative for photophobia, pain, discharge, redness, itching and visual disturbance.  Respiratory: Negative.  Negative for apnea, cough, choking, chest tightness, shortness of breath, wheezing and stridor.   Cardiovascular: Negative.  Negative for chest pain, palpitations and leg swelling.  Gastrointestinal: Negative.  Negative for abdominal distention, abdominal pain, anal bleeding, blood in stool, constipation, diarrhea, nausea, rectal pain and vomiting.  Endocrine: Negative.  Negative for cold intolerance, heat intolerance, polydipsia, polyphagia and polyuria.  Genitourinary: Negative.  Negative for decreased urine volume, difficulty urinating, dyspareunia, dysuria, enuresis, flank pain, frequency, genital sores, hematuria, menstrual problem, pelvic pain, urgency, vaginal bleeding, vaginal discharge and vaginal pain.  Musculoskeletal:  Negative.  Negative for arthralgias, back pain, gait problem, joint swelling, myalgias, neck pain and neck stiffness.  Skin: Negative.  Negative for color change, pallor, rash and wound.  Allergic/Immunologic: Positive for environmental allergies and food allergies. Negative for immunocompromised state.  Neurological: Negative.  Negative for dizziness, tremors, seizures, syncope, facial asymmetry, speech difficulty, weakness, light-headedness, numbness and headaches.  Hematological: Negative.  Negative for adenopathy. Does not bruise/bleed easily.  Psychiatric/Behavioral: Negative for agitation, behavioral problems, confusion, decreased concentration, dysphoric mood, hallucinations, self-injury, sleep disturbance and suicidal ideas. The patient is nervous/anxious. The patient is not hyperactive.    Past Medical History:  Diagnosis Date  . Allergy    Allegra, Flonase  . Anxiety   . GERD (gastroesophageal reflux disease)   . Herpes labialis   . Thyroid disease    Past Surgical History:  Procedure Laterality Date  . CESAREAN SECTION     x 1  . HERNIA REPAIR    . TUBAL LIGATION     Allergies  Allergen Reactions  . Amoxicillin Hives    Pt called in to report on 07/31/11  . Penicillins Hives  . Sulfamethoxazole-Trimethoprim     REACTION: conjunctivitis   Social History   Social History  . Marital status: Married    Spouse name: N/A  . Number of children: 3  . Years of education: N/A   Occupational History  . Not on file.   Social History Main Topics  . Smoking status: Never Smoker  . Smokeless tobacco: Never Used  . Alcohol use 0.6 oz/week    1 Glasses of wine per week  . Drug use: No  . Sexual activity: Yes   Other Topics Concern  . 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 Osteoporosis Mother   . Hypothyroidism Mother   . Alzheimer's disease Mother   . Hypertension Father   . Hypothyroidism Father   . Stroke Father   . Hypothyroidism Sister   . Angioedema Sister   . Hypothyroidism Sister   . Angioedema Sister   . Hypothyroidism Sister   . Angioedema Sister        Objective:   Physical Exam  Constitutional: She is oriented to person, place, and time. She appears well-developed and well-nourished. No distress.  HENT:  Head: Normocephalic and atraumatic.  Right Ear: External ear normal.  Left Ear: External ear normal.  Nose: Nose normal.  Mouth/Throat: Oropharynx is clear and moist.  Eyes: Conjunctivae and EOM are normal. Pupils are equal, round, and reactive to light.  Neck: Normal range of motion and full passive range of motion without pain. Neck supple. No JVD present. Carotid bruit is not present. No thyromegaly present.  Cardiovascular: Normal rate, regular rhythm and normal heart sounds.  Exam reveals no gallop and no friction rub.   No murmur heard. Pulmonary/Chest: Effort normal and breath sounds normal. She has no wheezes. She has no rales. Right breast exhibits no inverted nipple, no mass, no nipple discharge, no skin change and no tenderness. Left breast exhibits no inverted nipple, no mass, no nipple discharge, no skin change and no tenderness. Breasts are symmetrical.  Abdominal: Soft. Bowel sounds are normal. She exhibits no distension and no mass. There is no tenderness. There is no rebound and no guarding.  Genitourinary: Vagina normal and uterus normal. There is no rash, tenderness, lesion or injury on the right labia. There is no rash, tenderness, lesion or injury on the left labia. Cervix exhibits no motion tenderness.  Right adnexum displays no mass, no tenderness and no fullness. Left adnexum displays no mass, no tenderness and no fullness.  Musculoskeletal:       Right shoulder: Normal.       Left shoulder: Normal.       Cervical back: Normal.  Lymphadenopathy:    She has no cervical adenopathy.  Neurological: She is alert and oriented to person, place, and time. She has normal reflexes. No cranial nerve deficit. She exhibits normal muscle tone. Coordination normal.  Skin: Skin is warm and dry. No rash noted. She is not diaphoretic. No erythema. No pallor.  Psychiatric: She has a normal mood and affect. Her behavior is normal. Judgment and thought content normal.  Nursing note and vitals reviewed.  Depression screen [facility] 2/9 01/08/2016 01/08/2016 11/21/2015 06/11/2015  Decreased Interest 0 0 0 0  Down, Depressed, Hopeless 0 0 0 0  PHQ - 2 Score 0 0 0 0   Fall Risk  01/08/2016 01/08/2016 11/21/2015 06/11/2015  Falls in the past year? No No No No      Assessment & Plan:   1. Routine physical examination   2. Other allergic rhinitis   3. Gastroesophageal reflux disease without esophagitis   4. Herpes labialis   5. Anxiety  and depression   6. Fear of public speaking   7. Screening for HIV (human immunodeficiency virus)   8. Need for hepatitis C screening test   9. Recurrent infections   10. Cervical cancer screening   11. Encounter for screening mammogram for breast cancer   12. Screening for cardiovascular condition   13. Subclinical hypothyroidism   14. Screening for diabetes mellitus   15. Screening, lipid   16. Chronic fatigue   17. Irritable bowel disease    Orders Placed This Encounter  Procedures  . MM SCREENING BREAST TOMO BILATERAL  . HIV antibody  . Hepatitis C antibody  . CBC with Differential/Platelet  . Comprehensive metabolic panel  . Lipid panel  . Hemoglobin A1c  . TSH  . T4, free  . Vitamin B12  . VITAMIN D 25 Hydroxy (Vit-D Deficiency, Fractures)  . Celiac panel 10  .  Ambulatory referral to Allergy  . Ambulatory referral to Endocrinology  . POCT urinalysis dipstick  . POCT Microscopic Urinalysis ([facility])  . EKG 12-Lead   Meds ordered this encounter  Medications  . Zoster Vaccine Live, PF, (ZOSTAVAX) 16109 UNT/0.65ML injection    Sig: Inject 19,400 Units into the skin once.    Dispense:  0.65 mL    Refill:  0  . propranolol (INDERAL) 20 MG tablet    Sig: Take 2-3 tablets (40-60 mg total) by mouth once as needed.    Dispense:  30 tablet    Refill:  3  . montelukast (SINGULAIR) 10 MG tablet    Sig: Take 1 tablet (10 mg total) by mouth at bedtime.    Dispense:  90 tablet    Refill:  3  . FLUoxetine (PROZAC) 40 MG capsule    Sig: Take 2 capsules (80 mg total) by mouth daily.    Dispense:  180 capsule    Refill:  3  . fluticasone (FLONASE) 50 MCG/ACT nasal spray    Sig: Place 2 sprays into both nostrils daily.    Dispense:  16 g    Refill:  11   Cecilie Lowers, M.D. [facility] [address] [place], [place]  [address] [phone] phone [phone] fax

## 2016-01-08 NOTE — Patient Instructions (Addendum)
   IF you received an x-ray today, you will receive an invoice from [facility]. Please contact [facility] at [phone] with questions or concerns regarding your invoice.   IF you received labwork today, you will receive an invoice from [facility]. Please contact [facility] at [phone] with questions or concerns regarding your invoice.   Our billing staff will not be able to assist you with questions regarding bills from these companies.  You will be contacted with the lab results as soon as they are available. The fastest way to get your results is to activate your My Chart account. Instructions are located on the last page of this paperwork. If you have not heard from us regarding the results in 2 weeks, please contact this office.    Keeping You Healthy  Get These Tests  Blood Pressure- Have your blood pressure checked by your healthcare provider at least once a year.  Normal blood pressure is 120/80.  Weight- Have your body mass index (BMI) calculated to screen for obesity.  BMI is a measure of body fat based on height and weight.  You can calculate your own BMI at www.nhlbisupport.com/bmi/  Cholesterol- Have your cholesterol checked every year.  Diabetes- Have your blood sugar checked every year if you have high blood pressure, high cholesterol, a family history of diabetes or if you are overweight.  Pap Test - Have a pap test every 1 to 5 years if you have been sexually active.  If you are older than 65 and recent pap tests have been normal you may not need additional pap tests.  In addition, if you have had a hysterectomy  for benign disease additional pap tests are not necessary.  Mammogram-Yearly mammograms are essential for early detection of breast cancer  Screening for Colon Cancer- Colonoscopy starting at age 50. Screening may begin sooner depending on your family history and other health conditions.  Follow up colonoscopy  as directed by your Gastroenterologist.  Screening for Osteoporosis- Screening begins at age 65 with bone density scanning, sooner if you are at higher risk for developing Osteoporosis.  Get these medicines  Calcium with Vitamin D- Your body requires 1200-1500 mg of Calcium a day and 800-1000 IU of Vitamin D a day.  You can only absorb 500 mg of Calcium at a time therefore Calcium must be taken in 2 or 3 separate doses throughout the day.  Hormones- Hormone therapy has been associated with increased risk for certain cancers and heart disease.  Talk to your healthcare provider about if you need relief from menopausal symptoms.  Aspirin- Ask your healthcare provider about taking Aspirin to prevent Heart Disease and Stroke.  Get these Immuniztions  Flu shot- Every fall  Pneumonia shot- Once after the age of 65; if you are younger ask your healthcare provider if you need a pneumonia shot.  Tetanus- Every ten years.  Zostavax- Once after the age of 60 to prevent shingles.  Take these steps  Don't smoke- Your healthcare provider can help you quit. For tips on how to quit, ask your healthcare provider or go to www.smokefree.gov or call 1-800 QUIT-NOW.  Be physically active- Exercise 5 days a week for a minimum of 30 minutes.  If you are not already physically active, start slow and gradually work up to 30 minutes of moderate physical activity.  Try walking, dancing, bike riding, swimming, etc.  Eat a healthy diet- Eat a variety of healthy foods such as fruits, vegetables, whole   grains, low fat milk, low fat cheeses, yogurt, lean meats, chicken, fish, eggs, dried beans, tofu, etc.  For more information go to www.thenutritionsource.org  Dental visit- Brush and floss teeth twice daily; visit your dentist twice a year.  Eye exam- Visit your Optometrist or Ophthalmologist yearly.  Drink alcohol in moderation- Limit alcohol intake to one drink or less a day.  Never drink and  drive.  Depression- Your emotional health is as important as your physical health.  If you're feeling down or losing interest in things you normally enjoy, please talk to your healthcare provider.  Seat Belts- can save your life; always wear one  Smoke/Carbon Monoxide detectors- These detectors need to be installed on the appropriate level of your home.  Replace batteries at least once a year.  Violence- If anyone is threatening or hurting you, please tell your healthcare provider.  Living Will/ Health care power of attorney- Discuss with your healthcare provider and family. 

## 2016-01-09 LAB — VITAMIN D 25 HYDROXY (VIT D DEFICIENCY, FRACTURES): Vit D, 25-Hydroxy: 63 ng/mL (ref 30–100)

## 2016-01-09 LAB — CELIAC PANEL 10
Endomysial Screen: NEGATIVE
GLIADIN IGG: 3 U (ref <20)
Gliadin IgA: 3 Units (ref <20)
IGA: 133 mg/dL (ref 81–463)
Tissue Transglut Ab: 1 U/mL (ref <6)
Tissue Transglutaminase Ab, IgA: 1 U/mL (ref <4)

## 2016-01-09 LAB — VITAMIN B12: Vitamin B-12: 1352 pg/mL — ABNORMAL HIGH (ref 200–1100)

## 2016-01-10 LAB — PAP IG, CT-NG NAA, HPV HIGH-RISK
Chlamydia Probe Amp: NOT DETECTED
GC Probe Amp: NOT DETECTED
HPV DNA High Risk: NOT DETECTED

## 2016-02-04 ENCOUNTER — Ambulatory Visit: Payer: BLUE CROSS/BLUE SHIELD | Admitting: Allergy & Immunology

## 2016-02-18 ENCOUNTER — Ambulatory Visit (INDEPENDENT_AMBULATORY_CARE_PROVIDER_SITE_OTHER): Payer: BLUE CROSS/BLUE SHIELD | Admitting: Internal Medicine

## 2016-02-18 ENCOUNTER — Encounter: Payer: Self-pay | Admitting: Internal Medicine

## 2016-02-18 VITALS — BP 102/70 | HR 59 | Ht 67.5 in | Wt 145.0 lb

## 2016-02-18 DIAGNOSIS — E039 Hypothyroidism, unspecified: Secondary | ICD-10-CM | POA: Diagnosis not present

## 2016-02-18 NOTE — Patient Instructions (Signed)
Please stop at the lab.  Please continue NatureThroid 65 mg daily.  Take the thyroid hormone every day, with water, at least 30 minutes before breakfast, separated by at least 4 hours from: - acid reflux medications - calcium - iron - multivitamins  Please return in 1 year.

## 2016-02-18 NOTE — Progress Notes (Signed)
Patient ID: Shannon Adkins, female   DOB: [birth year], 65 y.o.   MRN: [id number]    HPI  Shannon Adkins is a 65 y.o.-year-old female, referred by her PCP, Dr. Selena Batten, for management of hypothyroidism.  Pt. has been dx with hypothyroidism in ~2011 >> on NatureThroid 65 mg (~100 mcg LT4) daily. Tried Armour >> she did not like it.  She takes the thyroid hormone: - fasting, in middle of the night or in am (2-5 am) - with water - separated by >30 min from b'fast  - no calcium- - + iron hours later - no PPIs - on multivitamins at bedtime - on B complex  - last dose 3-4 days ago  I reviewed pt's thyroid tests: Lab Results  Component Value Date   TSH 2.08 01/08/2016   TSH 2.836 06/11/2015   TSH 1.243 02/25/2014   FREET4 1.0 01/08/2016   FREET4 0.91 06/11/2015   FREET4 0.97 02/25/2014    Pt describes: - + intentional weight loss - + fatigue - + hot flushes  - for last 10-15 years - no cold intolerance - + anxiety and depression - controlled, on Prozac - + diarrhea and constipation - IBS (now constipated) - no dry skin - + hair loss  Pt denies feeling nodules in neck, + hoarseness, no dysphagia/odynophagia, SOB with lying down.  She has + FH of thyroid disorders in: 3 sisters and both parents: Hashimoto's ds. No FH of thyroid cancer.  No h/o radiation tx to head or neck. No recent use of iodine supplements.  Pt. also has a history of anxiety/depression, anemia, GERD - improved after changing her diet.  ROS: Constitutional: + see HPI, + hot flushes, + poor sleep, + nocturia Eyes: no blurry vision, no xerophthalmia ENT: no sore throat, no nodules palpated in throat, no dysphagia/odynophagia, no hoarseness Cardiovascular: no CP/insurance sales SOB/palpitations/leg swelling Respiratory: no cough/SOB Gastrointestinal: no N/V/+ D/+ C Musculoskeletal: no muscle/joint aches Skin: no rashes, + hair loss Neurological: no tremors/numbness/tingling/dizziness Psychiatric: +  depression/+ anxiety  Past Medical History:  Diagnosis Date  . Allergy    Allegra, Flonase  . Anxiety   . GERD (gastroesophageal reflux disease)   . Herpes labialis   . Thyroid disease    Past Surgical History:  Procedure Laterality Date  . CESAREAN SECTION     x 1  . HERNIA REPAIR    . TUBAL LIGATION     Social History   Social History  . Marital status: Married    Spouse name: N/A  . Number of children: 3 - daughter adopted from [place] Catering manager)   Occupational History  . Systems developer    Social History Main Topics  . Smoking status: Never Smoker  . Smokeless tobacco: Never Used  . Alcohol use     3 Glasses of wine per week  . 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 Ascorbic Acid (VITAMIN C)  1000 MG tablet Take 1,000 mg by mouth daily.    Marland Kitchen. b complex vitamins tablet Take 1 tablet by mouth daily.    Marland Kitchen. FLUoxetine (PROZAC) 40 MG capsule Take 2 capsules (80 mg total) by mouth daily. 180 capsule 3  . fluticasone (FLONASE) 50 MCG/ACT nasal spray Place 2 sprays into both nostrils daily. 16 g 11  . loratadine (CLARITIN) 10 MG tablet Take 10 mg by mouth daily.    Marland Kitchen. Lysine 500 MG CAPS Take by mouth.    . montelukast (SINGULAIR) 10 MG tablet Take 1 tablet (10 mg total) by mouth at bedtime. 90 tablet 3  . Multiple Vitamin (MULTIVITAMIN WITH MINERALS) TABS tablet Take 1 tablet by mouth daily.    . propranolol (INDERAL) 20 MG tablet Take 2-3 tablets (40-60 mg total) by mouth once as needed. 30 tablet 3  . thyroid (NATURE-THROID) 65  MG tablet Take 1 tablet (65 mg total) by mouth daily. 90 tablet 3   No current facility-administered medications on file prior to visit.    Allergies  Allergen Reactions  . Amoxicillin Hives    Pt called in to report on 07/31/11  . Penicillins Hives  . Sulfamethoxazole-Trimethoprim     REACTION: conjunctivitis   Family History  Problem Relation Age of Onset  . Osteoporosis Mother   . Hypothyroidism Mother   . Alzheimer's disease Mother   . Hypertension Father   . Hypothyroidism Father   . Stroke Father   . Hypothyroidism Sister   . Angioedema Sister   . Hypothyroidism Sister   . Angioedema Sister   . Hypothyroidism Sister   . Angioedema Sister    PE: BP 102/70 (BP Location: Left Arm, Patient Position: Sitting)   Pulse (!) 59   Ht 5' 7.5" (1.715 m)   Wt 145 lb (65.8 kg)   SpO2 97%   BMI 22.38 kg/m  Wt Readings from Last 3 Encounters:  02/18/16 145 lb (65.8 kg)  01/08/16 144 lb 3.2 oz (65.4 kg)  11/21/15 149 lb 6.4 oz (67.8 kg)   Constitutional: overweight, in NAD Eyes: PERRLA, EOMI, no exophthalmos ENT: moist mucous membranes, no thyromegaly, no cervical lymphadenopathy Cardiovascular: RRR, No MRG Respiratory: CTA B Gastrointestinal: abdomen soft, NT, ND, BS+ Musculoskeletal: no deformities, strength intact in all 4 Skin: moist, warm, no rashes Neurological: no tremor with outstretched hands, DTR normal in all 4  ASSESSMENT: 1. Hypothyroidism  PLAN:  1. Patient with long-standing hypothyroidism, on NatureThroid therapy. She has few symptoms that could be consistent with hypothyroidism, but are nonspecific (fatigue, constipation, hoarseness). We reviewed her most recent set of thyroid tests and these have been normal. We did discuss about the possibility of having Hashimoto's thyroiditis, the autoimmune disease, which is the most common cause of hypothyroidism in [place]. Moreover, she has an extensive family history of Hashimoto's thyroiditis, which makes her very  susceptible to it. She agrees to check her antibodies at this time, however, I explained that if these are not elevated, it does not mean that they were high in the past and decreased since diagnosis. - she does not appear to have a goiter, thyroid nodules, or neck compression symptoms. She does have occasional dysphagia, when she is more tired, which I explained that can occur in the setting of autoimmune inflammation. - We discussed about correct intake of thyroid hormone, fasting, with water, separated by at least 30 minutes from breakfast, and separated by more than 4 hours from calcium, iron, multivitamins, acid reflux medications (PPIs). She  is taking this correctly. - Since she recently had normal TFTs (they have been normal previously) we will not recheck them today. I will order her TPO and ATA antibodies, though. - If labs today are abnormal, she will need to return in 6 weeks for repeat labs - Otherwise, I will see her back in one year.  Orders Placed This Encounter  Procedures  . Thyroid peroxidase antibody  . Thyroglobulin antibody   Office Visit on 02/18/2016  Component Date Value Ref Range Status  . Thyroperoxidase Ab SerPl-aCnc 02/19/2016 <1  <9 IU/mL Final  . Thyroglobulin Ab 02/19/2016 <1  <2 IU/mL Final   No current diagnosis of Hashimoto thyroiditis. Carlus Pavlov, MD PhD [facility]

## 2016-02-19 LAB — THYROGLOBULIN ANTIBODY: Thyroglobulin Ab: <1 IU/mL (ref <2)

## 2016-02-19 LAB — THYROID PEROXIDASE ANTIBODY

## 2016-08-09 ENCOUNTER — Telehealth: Payer: Self-pay

## 2016-08-09 NOTE — Telephone Encounter (Signed)
Nature throid  65mg is on back order can they sub it?  for ?

## 2016-08-11 NOTE — Telephone Encounter (Signed)
[facility]; Nature throid 32.5mg  available; switched to [employer]ill Lynchature Throid 32.5mg  TWO daily.

## 2016-10-02 ENCOUNTER — Ambulatory Visit (INDEPENDENT_AMBULATORY_CARE_PROVIDER_SITE_OTHER): Payer: BLUE CROSS/BLUE SHIELD | Admitting: Family Medicine

## 2016-10-02 VITALS — BP 112/76 | HR 60 | Temp 98.3°F | Resp 16 | Ht 67.25 in | Wt 140.4 lb

## 2016-10-02 DIAGNOSIS — F40248 Other situational type phobia: Secondary | ICD-10-CM

## 2016-10-02 DIAGNOSIS — Z8269 Family history of other diseases of the musculoskeletal system and connective tissue: Secondary | ICD-10-CM | POA: Diagnosis not present

## 2016-10-02 DIAGNOSIS — F329 Major depressive disorder, single episode, unspecified: Secondary | ICD-10-CM | POA: Diagnosis not present

## 2016-10-02 DIAGNOSIS — R5383 Other fatigue: Secondary | ICD-10-CM | POA: Diagnosis not present

## 2016-10-02 DIAGNOSIS — J301 Allergic rhinitis due to pollen: Secondary | ICD-10-CM

## 2016-10-02 DIAGNOSIS — F32A Depression, unspecified: Secondary | ICD-10-CM

## 2016-10-02 DIAGNOSIS — F419 Anxiety disorder, unspecified: Secondary | ICD-10-CM | POA: Diagnosis not present

## 2016-10-02 DIAGNOSIS — F4010 Social phobia, unspecified: Secondary | ICD-10-CM

## 2016-10-02 DIAGNOSIS — K219 Gastro-esophageal reflux disease without esophagitis: Secondary | ICD-10-CM

## 2016-10-02 DIAGNOSIS — H04123 Dry eye syndrome of bilateral lacrimal glands: Secondary | ICD-10-CM | POA: Diagnosis not present

## 2016-10-02 DIAGNOSIS — K582 Mixed irritable bowel syndrome: Secondary | ICD-10-CM

## 2016-10-02 MED ORDER — FLUOXETINE HCL 20 MG PO CAPS: 20.0000 mg | ORAL_CAPSULE | Freq: Every day | ORAL | 0 refills | Status: DC

## 2016-10-02 MED ORDER — DULOXETINE HCL 60 MG PO CPEP: 60.0000 mg | ORAL_CAPSULE | Freq: Every day | ORAL | 1 refills | Status: DC

## 2016-10-02 NOTE — Patient Instructions (Addendum)
PROZAC : DECREASE TO  DAILY FOR TWO WEEKS THEN DECREASE TO  DAILY FOR TWO WEEKS THEN DECREASE TO  EVERY OTHER DAY FOR TWO WEEKS AND THEN STOP.  CYMBALTA : START ONE DAILY NOW.   We recommend that you schedule a mammogram for breast cancer screening. Typically, you do not need a referral to do this. Please contact a local imaging center to schedule your mammogram.  [facility] - [phone]  *ask for the Radiology Department [facility]) - [phone] or [phone]  [facility] - [phone] [facility] - [phone] [facility] - [phone]  *ask for the Radiology Department [facility] - [phone]  *ask for the Radiology Department [facility] - [phone]  *ask for the Mammography Department [facility] - [phone]  IF you received an x-ray today, you will receive an invoice from [facility]. Please contact [facility] at [phone] with questions or concerns regarding your invoice.   IF you received labwork today, you will receive an invoice from [place]. Please contact [facility] at [phone] with questions or concerns regarding your invoice.   Our billing staff will not be able to assist you with questions regarding bills from these companies.  You will be contacted with the lab results as soon as they are available. The fastest way to get your results is to activate your My Chart account. Instructions are located on the last page of this paperwork. If you have not heard from [place] regarding the results in 2 weeks, please contact this office.         IF you received an x-ray today, you will receive an invoice from [facility]. Please contact [facility] at [phone] with questions or concerns regarding your invoice.   IF you received labwork today, you will receive an invoice from [place].  Please contact [facility] at [phone] with questions or concerns regarding your invoice.   Our billing staff will not be able to assist you with questions regarding bills from these companies.  You will be contacted with the lab results as soon as they are available. The fastest way to get your results is to activate your My Chart account. Instructions are located on the last page of this paperwork. If you have not heard from [place] regarding the results in 2 weeks, please contact this office.

## 2016-10-02 NOTE — Progress Notes (Signed)
Subjective:    Patient ID: Shannon Adkins, female    DOB: August 26, 1950, 66 y.o.   MRN: [id number]  10/02/2016  med change (would like to change from Prozac to a different medication)   HPI This 66 y.o. female presents for evaluation of anxiety and depression.Interested in Cymbalta.  Very anxious currently; decreasing Prozac  and weaning.    IBS flared a few weeks; couldnot go anywhere.  Has calmed self down.  Prayer.  Husband is a handful; hard to deal with.  Not eating dairy; cannot tolerate dairy; trying to avoid bread.  Eating super healthy.  Lots of eggs, fish.   Dry eyes and chroinc dehydration.  Family history of Sjogren's.  Continues to suffer with chronic fatigue.  Recurrent infections.  Concern about underlying autoimmune disease.  Aunt died very young.  Three sisters with hereditary angioedema.    Hypothyroidism:  s/p Gherge consultation.  Too old for HRT.  Not sure why fatigued. No adjustments to medications.    Sleeping well; using essential oils and KavaKava.     Immunization History  Administered Date(s) Administered  . Influenza,inj,Quad PF,36+ Mos 02/25/2014, 06/11/2015  . Influenza-Unspecified 07/03/2016  . Td 06/09/1996  . Tdap 02/25/2014   BP Readings from Last 3 Encounters:  10/02/16 112/76  02/18/16 102/70  01/08/16 118/80   Wt Readings from Last 3 Encounters:  10/02/16 140 lb 6.4 oz (63.7 kg)  02/18/16 145 lb (65.8 kg)  01/08/16 144 lb 3.2 oz (65.4 kg)    Review of Systems  Constitutional: Positive for fatigue. Negative for chills, diaphoresis and fever.  Eyes: Negative for visual disturbance.  Respiratory: Negative for cough and shortness of breath.   Cardiovascular: Negative for chest pain, palpitations and leg swelling.  Gastrointestinal: Positive for diarrhea. Negative for abdominal pain, constipation, nausea and vomiting.  Endocrine: Negative for cold intolerance, heat intolerance, polydipsia, polyphagia and polyuria.  Neurological:  Negative for dizziness, tremors, seizures, syncope, facial asymmetry, speech difficulty, weakness, light-headedness, numbness and headaches.  Psychiatric/Behavioral: Positive for dysphoric mood. The patient is nervous/anxious.     Past Medical History:  Diagnosis Date  . Allergy    Allegra, Flonase  . Anxiety   . GERD (gastroesophageal reflux disease)   . Herpes labialis   . Thyroid disease    Past Surgical History:  Procedure Laterality Date  . CESAREAN SECTION     x 1  . HERNIA REPAIR    . TUBAL LIGATION     Allergies  Allergen Reactions  . Amoxicillin Hives    Pt called in to report on 07/31/11  . Penicillins Hives  . Sulfamethoxazole-Trimethoprim     REACTION: conjunctivitis    Social History   Social History  . Marital status: Married    Spouse name: N/A  . Number of children: 3  . Years of education: N/A   Occupational History  . Not on file.   Social History Main Topics  . Smoking status: Never Smoker  . Smokeless tobacco: Never Used  . Alcohol use 0.6 oz/week    1 Glasses of wine per week  . Drug use: No  . Sexual activity: Yes   Other Topics Concern  . 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 Osteoporosis Mother   . Hypothyroidism Mother   . Alzheimer's disease Mother   . Hypertension Father   . Hypothyroidism Father   . Stroke Father   . Hypothyroidism Sister   . Angioedema Sister   . Hypothyroidism Sister   . Angioedema Sister   . Hypothyroidism Sister   . Angioedema Sister        Objective:    BP 112/76   Pulse 60   Temp  98.3 F (36.8 C) (Oral)   Resp 16   Ht 5' 7.25" (1.708 m)   Wt 140 lb 6.4 oz (63.7 kg)   SpO2 97%   BMI 21.83 kg/m  Physical Exam  Constitutional: She is oriented to person, place, and time. She appears well-developed and well-nourished. No distress.  HENT:  Head: Normocephalic and atraumatic.  Right Ear: External ear normal.  Left Ear: External ear normal.  Nose: Nose normal.  Mouth/Throat: Oropharynx is clear and moist.  Eyes: Conjunctivae and EOM are normal. Pupils are equal, round, and reactive to light.  Neck: Normal range of motion. Neck supple. Carotid bruit is not present. No thyromegaly present.  Cardiovascular: Normal rate, regular rhythm, normal heart sounds and intact distal pulses.  Exam reveals no gallop and no friction rub.   No murmur heard. Pulmonary/Chest: Effort normal and breath sounds normal. She has no wheezes. She has no rales.  Abdominal: Soft. Bowel sounds are normal. She exhibits no distension and no mass. There is no tenderness. There is no rebound and no guarding.  Lymphadenopathy:    She has no cervical adenopathy.  Neurological: She is alert and oriented to person, place, and time. No cranial nerve deficit.  Skin: Skin is warm and dry. No rash noted. She is not diaphoretic. No erythema. No pallor.  Psychiatric: She has a normal mood and affect. Her behavior is normal. Judgment and thought content normal.   Results for orders placed or performed in visit on 02/18/16  Thyroid peroxidase antibody  Result Value Ref Range   Thyroperoxidase Ab SerPl-aCnc <1 <9 IU/mL  Thyroglobulin antibody  Result Value Ref Range   Thyroglobulin Ab <1 <2 IU/mL       Assessment & Plan:   1. Anxiety and depression   2. Dry eyes   3. Family history of Sjogren's disease   4. Other fatigue   5. Fear of public speaking   6. Seasonal allergic rhinitis due to pollen   7. Gastroesophageal reflux disease without esophagitis    -anxiety has worsened due to family  stressors; wean Prozac as outlined; start Cymbalta  daily. -refer to rheumatology due to fatigue and dry mouth, dry eyes and family history of Sjogren's disease.  -chronic IBS with recent flare due to stress.  Stable at this time. Colonoscopy in 2016 [place].  Orders Placed This Encounter  Procedures  . Ambulatory referral to Rheumatology    Referral Priority:   Routine    Referral Type:   Consultation    Referral Reason:   Specialty Services Required    Requested Specialty:   Rheumatology    Number of Visits Requested:   1   Meds ordered this encounter  Medications  . thyroid (ARMOUR) 65 MG tablet  Sig: Take 65 mg by mouth daily.  . DULoxetine (CYMBALTA) 60 MG capsule    Sig: Take 1 capsule (60 mg total) by mouth daily.    Dispense:  90 capsule    Refill:  1  . FLUoxetine (PROZAC) 20 MG capsule    Sig: Take 1 capsule (20 mg total) by mouth daily.    Dispense:  60 capsule    Refill:  0    Return in about 6 months (around 04/03/2017) for recheck.   Mellina Benison Paulita Fujita, M.D. Primary [facility] at [facility] previously [facility] [address] [place], [place]  [address] [phone] phone [phone] fax

## 2016-10-27 DIAGNOSIS — K582 Mixed irritable bowel syndrome: Secondary | ICD-10-CM

## 2016-10-27 HISTORY — DX: Mixed irritable bowel syndrome: K58.2

## 2016-12-15 ENCOUNTER — Encounter: Payer: Self-pay | Admitting: Family Medicine

## 2016-12-15 MED ORDER — HYOSCYAMINE SULFATE ER 0.375 MG PO TB12: 0.3750 mg | ORAL_TABLET | Freq: Two times a day (BID) | ORAL | 0 refills | Status: DC

## 2016-12-25 ENCOUNTER — Encounter: Payer: Self-pay | Admitting: Family Medicine

## 2017-01-03 MED ORDER — DIPHENOXYLATE-ATROPINE 2.5-0.025 MG PO TABS: 1.0000 | ORAL_TABLET | Freq: Four times a day (QID) | ORAL | 4 refills | Status: DC | PRN

## 2017-01-03 NOTE — Telephone Encounter (Signed)
Please call in Lomotil rx as approved.

## 2017-01-06 ENCOUNTER — Other Ambulatory Visit: Payer: Self-pay | Admitting: Family Medicine

## 2017-01-15 ENCOUNTER — Ambulatory Visit: Payer: BLUE CROSS/BLUE SHIELD | Admitting: Internal Medicine

## 2017-02-16 ENCOUNTER — Encounter (HOSPITAL_BASED_OUTPATIENT_CLINIC_OR_DEPARTMENT_OTHER): Payer: Self-pay | Admitting: *Deleted

## 2017-02-17 ENCOUNTER — Encounter (HOSPITAL_BASED_OUTPATIENT_CLINIC_OR_DEPARTMENT_OTHER): Payer: Self-pay | Admitting: Anesthesiology

## 2017-02-17 ENCOUNTER — Ambulatory Visit (HOSPITAL_BASED_OUTPATIENT_CLINIC_OR_DEPARTMENT_OTHER): Payer: BLUE CROSS/BLUE SHIELD | Admitting: Anesthesiology

## 2017-02-17 ENCOUNTER — Encounter (HOSPITAL_BASED_OUTPATIENT_CLINIC_OR_DEPARTMENT_OTHER)
Admission: RE | Disposition: A | Discharge status: Home / Self Care | Payer: Self-pay | Source: Ambulatory Visit | Attending: Orthopedic Surgery

## 2017-02-17 ENCOUNTER — Ambulatory Visit (HOSPITAL_BASED_OUTPATIENT_CLINIC_OR_DEPARTMENT_OTHER)
Admission: RE | Admit: 2017-02-17 | Discharge: 2017-02-17 | Disposition: A | Discharge status: Home / Self Care | Payer: BLUE CROSS/BLUE SHIELD | Source: Ambulatory Visit | Attending: Orthopedic Surgery | Admitting: Orthopedic Surgery

## 2017-02-17 DIAGNOSIS — W010XXA Fall on same level from slipping, tripping and stumbling without subsequent striking against object, initial encounter: Secondary | ICD-10-CM | POA: Insufficient documentation

## 2017-02-17 DIAGNOSIS — K589 Irritable bowel syndrome without diarrhea: Secondary | ICD-10-CM | POA: Diagnosis not present

## 2017-02-17 DIAGNOSIS — Z79899 Other long term (current) drug therapy: Secondary | ICD-10-CM | POA: Insufficient documentation

## 2017-02-17 DIAGNOSIS — Z880 Allergy status to penicillin: Secondary | ICD-10-CM | POA: Insufficient documentation

## 2017-02-17 DIAGNOSIS — S52501A Unspecified fracture of the lower end of right radius, initial encounter for closed fracture: Secondary | ICD-10-CM | POA: Diagnosis present

## 2017-02-17 DIAGNOSIS — F419 Anxiety disorder, unspecified: Secondary | ICD-10-CM | POA: Diagnosis not present

## 2017-02-17 DIAGNOSIS — Z882 Allergy status to sulfonamides status: Secondary | ICD-10-CM | POA: Insufficient documentation

## 2017-02-17 DIAGNOSIS — E039 Hypothyroidism, unspecified: Secondary | ICD-10-CM | POA: Diagnosis not present

## 2017-02-17 DIAGNOSIS — K219 Gastro-esophageal reflux disease without esophagitis: Secondary | ICD-10-CM | POA: Diagnosis not present

## 2017-02-17 DIAGNOSIS — Y9289 Other specified places as the place of occurrence of the external cause: Secondary | ICD-10-CM | POA: Diagnosis not present

## 2017-02-17 HISTORY — DX: Fracture of unspecified carpal bone, right wrist, initial encounter for closed fracture: S62.101A

## 2017-02-17 HISTORY — PX: ORIF WRIST FRACTURE: SHX2133

## 2017-02-17 HISTORY — DX: Irritable bowel syndrome, unspecified: K58.9

## 2017-02-17 HISTORY — DX: Hypothyroidism, unspecified: E03.9

## 2017-02-17 SURGERY — OPEN REDUCTION INTERNAL FIXATION (ORIF) WRIST FRACTURE
Anesthesia: General | Site: Wrist | Laterality: Right

## 2017-02-17 MED ORDER — ONDANSETRON HCL 4 MG PO TABS: 4.0000 mg | ORAL_TABLET | Freq: Three times a day (TID) | ORAL | 0 refills | Status: DC | PRN

## 2017-02-17 MED ORDER — GABAPENTIN 300 MG PO CAPS
ORAL_CAPSULE | ORAL | Status: AC
Start: 2017-02-17 — End: 2017-02-17
  Filled 2017-02-17: qty 1

## 2017-02-17 MED ORDER — FENTANYL CITRATE (PF) 100 MCG/2ML IJ SOLN
INTRAMUSCULAR | Status: AC
  Filled 2017-02-17: qty 2

## 2017-02-17 MED ORDER — MIDAZOLAM HCL 2 MG/2ML IJ SOLN
1.0000 mg | INTRAMUSCULAR | Status: DC | PRN
  Administered 2017-02-17: 1 mg via INTRAVENOUS

## 2017-02-17 MED ORDER — PROPOFOL 10 MG/ML IV BOLUS
INTRAVENOUS | Status: DC | PRN
  Administered 2017-02-17: 50 mg via INTRAVENOUS
  Administered 2017-02-17: 150 mg via INTRAVENOUS

## 2017-02-17 MED ORDER — LIDOCAINE 2% (20 MG/ML) 5 ML SYRINGE
INTRAMUSCULAR | Status: AC
  Filled 2017-02-17: qty 5

## 2017-02-17 MED ORDER — DEXAMETHASONE SODIUM PHOSPHATE 10 MG/ML IJ SOLN
INTRAMUSCULAR | Status: AC
  Filled 2017-02-17: qty 1

## 2017-02-17 MED ORDER — PHENYLEPHRINE 40 MCG/ML (10ML) SYRINGE FOR IV PUSH (FOR BLOOD PRESSURE SUPPORT)
PREFILLED_SYRINGE | INTRAVENOUS | Status: AC
  Filled 2017-02-17: qty 10

## 2017-02-17 MED ORDER — PROPOFOL 10 MG/ML IV BOLUS
INTRAVENOUS | Status: AC
  Filled 2017-02-17: qty 20

## 2017-02-17 MED ORDER — CHLORHEXIDINE GLUCONATE 4 % EX LIQD: 60.0000 mL | Freq: Once | CUTANEOUS | Status: DC

## 2017-02-17 MED ORDER — ACETAMINOPHEN 500 MG PO TABS
1000.0000 mg | ORAL_TABLET | Freq: Once | ORAL | Status: AC
  Administered 2017-02-17: 1000 mg via ORAL

## 2017-02-17 MED ORDER — GABAPENTIN 300 MG PO CAPS
300.0000 mg | ORAL_CAPSULE | Freq: Once | ORAL | Status: AC
  Administered 2017-02-17: 300 mg via ORAL

## 2017-02-17 MED ORDER — DEXAMETHASONE SODIUM PHOSPHATE 4 MG/ML IJ SOLN
INTRAMUSCULAR | Status: DC | PRN
  Administered 2017-02-17: 10 mg via INTRAVENOUS

## 2017-02-17 MED ORDER — CEFAZOLIN SODIUM-DEXTROSE 2-4 GM/100ML-% IV SOLN
2.0000 g | INTRAVENOUS | Status: AC
  Administered 2017-02-17: 2 g via INTRAVENOUS

## 2017-02-17 MED ORDER — SCOPOLAMINE 1 MG/3DAYS TD PT72: 1.0000 | MEDICATED_PATCH | Freq: Once | TRANSDERMAL | Status: DC | PRN

## 2017-02-17 MED ORDER — EPHEDRINE 5 MG/ML INJ
INTRAVENOUS | Status: AC
Start: 2017-02-17 — End: 2017-02-17
  Filled 2017-02-17: qty 10

## 2017-02-17 MED ORDER — EPHEDRINE SULFATE 50 MG/ML IJ SOLN
INTRAMUSCULAR | Status: DC | PRN
  Administered 2017-02-17 (×2): 10 mg via INTRAVENOUS

## 2017-02-17 MED ORDER — FENTANYL CITRATE (PF) 100 MCG/2ML IJ SOLN
50.0000 ug | INTRAMUSCULAR | Status: DC | PRN
  Administered 2017-02-17: 50 ug via INTRAVENOUS

## 2017-02-17 MED ORDER — CEFAZOLIN SODIUM-DEXTROSE 2-4 GM/100ML-% IV SOLN
INTRAVENOUS | Status: AC
  Filled 2017-02-17: qty 100

## 2017-02-17 MED ORDER — ROPIVACAINE HCL 5 MG/ML IJ SOLN
INTRAMUSCULAR | Status: DC | PRN
  Administered 2017-02-17: 30 mL via PERINEURAL

## 2017-02-17 MED ORDER — DEXTROSE 5 % IV SOLN
INTRAVENOUS | Status: DC | PRN
  Administered 2017-02-17: 25 ug/min via INTRAVENOUS

## 2017-02-17 MED ORDER — ONDANSETRON HCL 4 MG/2ML IJ SOLN
INTRAMUSCULAR | Status: AC
  Filled 2017-02-17: qty 2

## 2017-02-17 MED ORDER — LIDOCAINE HCL (CARDIAC) 20 MG/ML IV SOLN
INTRAVENOUS | Status: DC | PRN
  Administered 2017-02-17: 10 mg via INTRAVENOUS

## 2017-02-17 MED ORDER — DOCUSATE SODIUM 100 MG PO CAPS: 100.0000 mg | ORAL_CAPSULE | Freq: Two times a day (BID) | ORAL | 0 refills | Status: DC

## 2017-02-17 MED ORDER — MIDAZOLAM HCL 5 MG/5ML IJ SOLN
INTRAMUSCULAR | Status: DC | PRN
  Administered 2017-02-17: 2 mg via INTRAVENOUS

## 2017-02-17 MED ORDER — MIDAZOLAM HCL 2 MG/2ML IJ SOLN
INTRAMUSCULAR | Status: AC
Start: 2017-02-17 — End: 2017-02-17
  Filled 2017-02-17: qty 2

## 2017-02-17 MED ORDER — PHENYLEPHRINE HCL 10 MG/ML IJ SOLN
INTRAMUSCULAR | Status: DC | PRN
  Administered 2017-02-17 (×4): 80 ug via INTRAVENOUS
  Administered 2017-02-17: 40 ug via INTRAVENOUS
  Administered 2017-02-17: 80 ug via INTRAVENOUS

## 2017-02-17 MED ORDER — LACTATED RINGERS IV SOLN
INTRAVENOUS | Status: DC
  Administered 2017-02-17: 13:00:00 via INTRAVENOUS

## 2017-02-17 MED ORDER — PHENYLEPHRINE HCL 10 MG/ML IJ SOLN
INTRAMUSCULAR | Status: AC
  Filled 2017-02-17: qty 1

## 2017-02-17 MED ORDER — ONDANSETRON HCL 4 MG/2ML IJ SOLN
INTRAMUSCULAR | Status: DC | PRN
Start: 2017-02-17 — End: 2017-02-17
  Administered 2017-02-17: 4 mg via INTRAVENOUS

## 2017-02-17 MED ORDER — LACTATED RINGERS IV SOLN
INTRAVENOUS | Status: DC
  Administered 2017-02-17 (×3): via INTRAVENOUS

## 2017-02-17 MED ORDER — ACETAMINOPHEN 500 MG PO TABS
ORAL_TABLET | ORAL | Status: AC
  Filled 2017-02-17: qty 2

## 2017-02-17 MED ORDER — MIDAZOLAM HCL 2 MG/2ML IJ SOLN
INTRAMUSCULAR | Status: AC
  Filled 2017-02-17: qty 2

## 2017-02-17 MED ORDER — FENTANYL CITRATE (PF) 100 MCG/2ML IJ SOLN
INTRAMUSCULAR | Status: DC | PRN
  Administered 2017-02-17: 50 ug via INTRAVENOUS

## 2017-02-17 SURGICAL SUPPLY — 66 items
BANDAGE ACE 4X5 VEL STRL LF (GAUZE/BANDAGES/DRESSINGS) ×2 IMPLANT
BIT DRILL 2.2 SS TIBIAL (BIT) ×1 IMPLANT
BLADE SURG 15 STRL LF DISP TIS (BLADE) ×2 IMPLANT
BLADE SURG 15 STRL SS (BLADE) ×4
BNDG CMPR 9X4 STRL LF SNTH (GAUZE/BANDAGES/DRESSINGS) ×1
BNDG ESMARK 4X9 LF (GAUZE/BANDAGES/DRESSINGS) ×2 IMPLANT
CHLORAPREP W/TINT 26ML (MISCELLANEOUS) ×2 IMPLANT
CLSR STERI-STRIP ANTIMIC 1/2X4 (GAUZE/BANDAGES/DRESSINGS) ×2 IMPLANT
CORD BIPOLAR FORCEPS 12FT (ELECTRODE) ×2 IMPLANT
COVER BACK TABLE 60X90IN (DRAPES) ×2 IMPLANT
CUFF TOURNIQUET SINGLE 18IN (TOURNIQUET CUFF) ×1 IMPLANT
DRAPE EXTREMITY T 121X128X90 (DRAPE) ×2 IMPLANT
DRAPE IMP U-DRAPE 54X76 (DRAPES) ×2 IMPLANT
DRAPE OEC MINIVIEW 54X84 (DRAPES) ×3 IMPLANT
DRAPE SURG 17X23 STRL (DRAPES) ×1 IMPLANT
GAUZE SPONGE 4X4 12PLY STRL (GAUZE/BANDAGES/DRESSINGS) ×2 IMPLANT
GAUZE XEROFORM 1X8 LF (GAUZE/BANDAGES/DRESSINGS) IMPLANT
GLOVE BIO SURGEON STRL SZ7.5 (GLOVE) ×5 IMPLANT
GLOVE BIOGEL PI IND STRL 8 (GLOVE) ×2 IMPLANT
GLOVE BIOGEL PI INDICATOR 8 (GLOVE) ×2
GOWN STRL REUS W/ TWL LRG LVL3 (GOWN DISPOSABLE) ×2 IMPLANT
GOWN STRL REUS W/ TWL XL LVL3 (GOWN DISPOSABLE) ×1 IMPLANT
GOWN STRL REUS W/TWL LRG LVL3 (GOWN DISPOSABLE) ×4
GOWN STRL REUS W/TWL XL LVL3 (GOWN DISPOSABLE) ×2
K-WIRE 1.6 (WIRE) ×4
K-WIRE FX5X1.6XNS BN SS (WIRE) ×2
KWIRE FX5X1.6XNS BN SS (WIRE) IMPLANT
NDL HYPO 25X1 1.5 SAFETY (NEEDLE) ×1 IMPLANT
NEEDLE HYPO 25X1 1.5 SAFETY (NEEDLE) ×2 IMPLANT
NS IRRIG 1000ML POUR BTL (IV SOLUTION) ×2 IMPLANT
PACK BASIN DAY SURGERY FS (CUSTOM PROCEDURE TRAY) ×2 IMPLANT
PAD CAST 4YDX4 CTTN HI CHSV (CAST SUPPLIES) ×1 IMPLANT
PADDING CAST ABS 4INX4YD NS (CAST SUPPLIES) ×1
PADDING CAST ABS COTTON 4X4 ST (CAST SUPPLIES) ×1 IMPLANT
PADDING CAST COTTON 4X4 STRL (CAST SUPPLIES) ×2
PEG LOCKING SMOOTH 2.2X20 (Screw) ×3 IMPLANT
PEG LOCKING SMOOTH 2.2X22 (Screw) ×4 IMPLANT
PLATE DVR CROSSLOCK STD RT (Plate) ×1 IMPLANT
SCREW  LP NL 2.7X15MM (Screw) ×1 IMPLANT
SCREW LOCK 14X2.7X 3 LD TPR (Screw) IMPLANT
SCREW LOCKING 2.7X14 (Screw) ×4 IMPLANT
SCREW LP NL 2.7X15MM (Screw) IMPLANT
SLEEVE SCD COMPRESS KNEE MED (MISCELLANEOUS) ×1 IMPLANT
SLING ARM FOAM STRAP LRG (SOFTGOODS) ×1 IMPLANT
SPLINT PLASTER CAST XFAST 3X15 (CAST SUPPLIES) IMPLANT
SPLINT PLASTER CAST XFAST 4X15 (CAST SUPPLIES) IMPLANT
SPLINT PLASTER XTRA FAST SET 4 (CAST SUPPLIES) ×10
SPLINT PLASTER XTRA FASTSET 3X (CAST SUPPLIES)
SUCTION FRAZIER HANDLE 10FR (MISCELLANEOUS) ×1
SUCTION TUBE FRAZIER 10FR DISP (MISCELLANEOUS) IMPLANT
SUT ETHILON 3 0 PS 1 (SUTURE) ×1 IMPLANT
SUT ETHILON 4 0 PS 2 18 (SUTURE) ×1 IMPLANT
SUT MNCRL AB 3-0 PS2 18 (SUTURE) ×1 IMPLANT
SUT MON AB 2-0 CT1 36 (SUTURE) ×2 IMPLANT
SUT MON AB 4-0 PC3 18 (SUTURE) IMPLANT
SUT PROLENE 3 0 PS 2 (SUTURE) IMPLANT
SUT VIC AB 0 SH 27 (SUTURE) IMPLANT
SUT VIC AB 2-0 SH 27 (SUTURE)
SUT VIC AB 2-0 SH 27XBRD (SUTURE) IMPLANT
SUT VIC AB 3-0 FS2 27 (SUTURE) IMPLANT
SYR BULB 3OZ (MISCELLANEOUS) ×2 IMPLANT
SYR CONTROL 10ML LL (SYRINGE) ×2 IMPLANT
TOWEL OR 17X24 6PK STRL BLUE (TOWEL DISPOSABLE) ×2 IMPLANT
TOWEL OR NON WOVEN STRL DISP B (DISPOSABLE) ×2 IMPLANT
TUBE CONNECTING 20X1/4 (TUBING) ×1 IMPLANT
UNDERPAD 30X30 (UNDERPADS AND DIAPERS) ×2 IMPLANT

## 2017-02-17 NOTE — Anesthesia Procedure Notes (Signed)
Procedure Name: LMA Insertion Date/Time: 02/17/2017 2:15 PM Performed by: Genevieve NorlanderLINKA, Shannon Adkins Pre-anesthesia Checklist: Patient identified, Emergency Drugs available, Suction available, Patient being monitored and Timeout performed Patient Re-evaluated:Patient Re-evaluated prior to induction Oxygen Delivery Method: Circle system utilized Preoxygenation: Pre-oxygenation with 100% oxygen Induction Type: IV induction Ventilation: Mask ventilation without difficulty LMA: LMA inserted LMA Size: 4.0 Number of attempts: 1 Airway Equipment and Method: Bite block Placement Confirmation: positive ETCO2 Tube secured with: Tape Dental Injury: Teeth and Oropharynx as per pre-operative assessment

## 2017-02-17 NOTE — Anesthesia Procedure Notes (Signed)
Anesthesia Regional Block: Supraclavicular block   Pre-Anesthetic Checklist: ,, timeout performed, Correct Patient, Correct Site, Correct Laterality, Correct Procedure,, site marked, risks and benefits discussed, Surgical consent,  Pre-op evaluation,  At surgeon's request and post-op pain management  Laterality: Right  Prep: chloraprep       Needles:  Injection technique: Single-shot  Needle Type: Echogenic Stimulator Needle     Needle Length: 9cm  Needle Gauge: 21     Additional Needles:   Procedures: ultrasound guided,,,,,,,,  Narrative:  Start time: 02/17/2017 1:40 PM End time: 02/17/2017 1:50 PM Injection made incrementally with aspirations every 5 mL.  Performed by: Personally  Anesthesiologist: Karna ChristmasELLENDER, Zyria Fiscus P  Additional Notes: Functioning IV was confirmed and monitors were applied.  A 90mm 21ga Arrow echogenic stimulator needle was used. Sterile prep, hand hygiene and sterile gloves were used.  Negative aspiration and negative test dose prior to incremental administration of local anesthetic. The patient tolerated the procedure well.

## 2017-02-17 NOTE — Anesthesia Preprocedure Evaluation (Addendum)
Anesthesia Evaluation  Patient identified by MRN, date of birth, ID band Patient awake    Reviewed: Allergy & Precautions, NPO status , Patient's Chart, lab work & pertinent test results  Airway Mallampati: III  TM Distance: >3 FB Neck ROM: Full   Comment: Abrasions to upper and lower lips Dental   Left upper central displaced from fall:   Pulmonary neg pulmonary ROS,    Pulmonary exam normal breath sounds clear to auscultation       Cardiovascular negative cardio ROS Normal cardiovascular exam Rhythm:Regular Rate:Normal  ECG: SB, rate 56   Neuro/Psych PSYCHIATRIC DISORDERS Anxiety negative neurological ROS     GI/Hepatic Neg liver ROS, GERD  Controlled and Medicated,IBS (irritable bowel syndrome)   Endo/Other  Hypothyroidism   Renal/GU negative Renal ROS     Musculoskeletal negative musculoskeletal ROS (+)   Abdominal   Peds  Hematology negative hematology ROS (+)   Anesthesia Other Findings   Reproductive/Obstetrics                            Anesthesia Physical Anesthesia Plan  ASA: II  Anesthesia Plan: General and Regional   Post-op Pain Management: GA combined w/ Regional for post-op pain   Induction: Intravenous  PONV Risk Score and Plan: 3 and Ondansetron, Dexamethasone and Midazolam  Airway Management Planned: LMA  Additional Equipment:   Intra-op Plan:   Post-operative Plan: Extubation in OR  Informed Consent: I have reviewed the patients History and Physical, chart, labs and discussed the procedure including the risks, benefits and alternatives for the proposed anesthesia with the patient or authorized representative who has indicated his/her understanding and acceptance.   Dental advisory given  Plan Discussed with: CRNA  Anesthesia Plan Comments:         Anesthesia Quick Evaluation

## 2017-02-17 NOTE — H&P (Signed)
ORTHOPAEDIC CONSULTATION  REQUESTING PHYSICIAN: Renette Butters, MD  Chief Complaint: right DR fracture  HPI: Shannon Adkins is a 66 y.o. female who complains of fall over a speed bump, pain at R wrist  Past Medical History:  Diagnosis Date  . Allergy    Allegra, Flonase  . Anxiety   . GERD (gastroesophageal reflux disease)   . Herpes labialis   . Hypothyroidism   . IBS (irritable bowel syndrome)   . Right wrist fracture   . Thyroid disease    Past Surgical History:  Procedure Laterality Date  . CESAREAN SECTION     x 1  . HERNIA REPAIR Left   . TUBAL LIGATION     Social History   Social History  . Marital status: Married    Spouse name: N/A  . Number of children: 3  . Years of education: N/A   Social History Main Topics  . Smoking status: Never Smoker  . Smokeless tobacco: Never Used  . Alcohol use 0.6 oz/week    1 Glasses of wine per week     Comment: social  . Drug use: No  . Sexual activity: Yes   Other Topics Concern  . 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 Osteoporosis Mother   . Hypothyroidism Mother   . Alzheimer's disease Mother   . Hypertension Father   . Hypothyroidism Father   . Stroke Father   . Hypothyroidism Sister   . Angioedema Sister   . Hypothyroidism Sister   . Angioedema Sister   . Hypothyroidism Sister   . Angioedema Sister    Allergies  Allergen Reactions  . Amoxicillin Hives    Pt called in to report on 07/31/11  . Lactose Intolerance (Gi)   . Penicillins Hives  . Sulfamethoxazole-Trimethoprim    REACTION: conjunctivitis   Prior to Admission medications   Medication Sig Start Date End Date Taking? Authorizing Provider  Ascorbic Acid (VITAMIN C) 1000 MG tablet Take 1,000 mg by mouth daily.   Yes [provider]  b complex vitamins tablet Take 1 tablet by mouth daily.   Yes [provider]  diphenoxylate-atropine (LOMOTIL) 2.5-0.025 MG tablet Take 1 tablet by mouth 4 (four) times daily as needed for diarrhea or loose stools. 01/03/17  Yes Wardell Honour, MD  DULoxetine (CYMBALTA) 60 MG capsule Take 1 capsule (60 mg total) by mouth daily. 10/02/16  Yes Wardell Honour, MD  fluticasone Asencion Islam) 50 MCG/ACT nasal spray Place 2 sprays into both nostrils daily. 01/08/16  Yes Wardell Honour, MD  hyoscyamine (LEVBID) 0.375 MG 12 hr tablet Take 1 tablet (0.375 mg total) by mouth 2 (two) times daily. 12/15/16  Yes Wardell Honour, MD  Loperamide HCl (IMODIUM PO) Take by mouth.   Yes [provider]  loratadine (CLARITIN) 10 MG tablet Take 10 mg by mouth daily.   Yes [provider]  montelukast (SINGULAIR) 10  MG tablet Take 1 tablet (10 mg total) by mouth at bedtime. 01/08/16  Yes Wardell Honour, MD  NATURE-THROID 65 MG tablet TAKE ONE TABLET BY MOUTH DAILY 01/07/17  Yes Wardell Honour, MD  oxyCODONE-acetaminophen (PERCOCET/ROXICET) 5-325 MG tablet Take by mouth every 4 (four) hours as needed for severe pain.   Yes [provider]   No results found.  Positive ROS: All other systems have been reviewed and were otherwise negative with the exception of those mentioned in the HPI and as above.  Labs cbc No results for input(s): WBC, HGB, HCT, PLT in the last 72 hours.  Labs inflam No results for input(s): CRP in the last 72 hours.  Invalid input(s): ESR  Labs coag No results for input(s): INR, PTT in the last 72 hours.  Invalid input(s): PT  No results for input(s): NA, K, CL, CO2, GLUCOSE, BUN, CREATININE, CALCIUM in the last 72 hours.  Physical  Exam: Vitals:   02/17/17 1223  BP: 110/84  Pulse: 64  Resp: 18  Temp: 98.1 F (36.7 C)  SpO2: 100%   General: Alert, no acute distress Cardiovascular: No pedal edema Respiratory: No cyanosis, no use of accessory musculature GI: No organomegaly, abdomen is soft and non-tender Skin: No lesions in the area of chief complaint other than those listed below in MSK exam.  Neurologic: Sensation intact distally save for the below mentioned MSK exam Psychiatric: Patient is competent for consent with normal mood and affect Lymphatic: No axillary or cervical lymphadenopathy  MUSCULOSKELETAL:  RUE: NVI, compartments soft Other extremities are atraumatic with painless ROM and NVI.  Assessment: R DR fracture  Plan: ORIF today   Renette Butters, MD Cell [phone]   02/17/2017 12:48 PM

## 2017-02-17 NOTE — Anesthesia Postprocedure Evaluation (Signed)
Anesthesia Post Note  Patient: Theatre stage managerDianne Mcneill  Procedure(s) Performed: Procedure(s) (LRB): OPEN REDUCTION INTERNAL FIXATION (ORIF) WRIST FRACTURE (Right)     Patient location during evaluation: PACU Anesthesia Type: General Level of consciousness: awake and alert and oriented Pain management: pain level controlled Vital Signs Assessment: post-procedure vital signs reviewed and stable Respiratory status: nonlabored ventilation, spontaneous breathing and respiratory function stable Cardiovascular status: blood pressure returned to baseline and stable Postop Assessment: no signs of nausea or vomiting Anesthetic complications: no    Last Vitals:  Vitals:   02/17/17 1630 02/17/17 1649  BP: 111/77 110/74  Pulse: 78 71  Resp: (!) 21 14  Temp:  36.5 C  SpO2: 95% 95%    Last Pain:  Vitals:   02/17/17 1649  TempSrc: Oral  PainSc: 0-No pain                 Maliek Schellhorn A.

## 2017-02-17 NOTE — Discharge Instructions (Signed)
Keep wrist elevated with ice as much as possible to reduce pain and swelling.  Diet: As you were doing prior to hospitalization   Shower:  You have a splint on, leave the splint in place and keep the splint dry with a plastic bag.  Dressing:  You have a splint - leave the splint in place and we will change your bandages during your first follow-up appointment.    Activity:  Increase activity slowly as tolerated, but follow the weight bearing instructions below.  The rules on driving is that you can not be taking narcotics while you drive, and you must feel in control of the vehicle.    Weight Bearing:  Non weight bearing affected wrist.  Sling for comfort.   To prevent constipation: you may use a stool softener such as -  Colace (over the counter) 100 mg by mouth twice a day  Drink plenty of fluids (prune juice may be helpful) and high fiber foods Miralax (over the counter) for constipation as needed.    Itching:  If you experience itching with your medications, try taking only a single pain pill, or even half a pain pill at a time.  You can also use benadryl over the counter for itching or also to help with sleep.   Precautions:  If you experience chest pain or shortness of breath - call [phone] immediately for transfer to the hospital emergency department!!  If you develop a fever greater that 101 F, purulent drainage from wound, increased redness or drainage from wound, or calf pain -- Call the office at [phone]                                                 Follow- Up Appointment:  Please call for an appointment to be seen in 2 weeks [place] - [phone]      Post Anesthesia Home Care Instructions  Activity: Get plenty of rest for the remainder of the day. A responsible individual must stay with you for 24 hours following the procedure.  For the next 24 hours, DO NOT: -Drive a car -Advertising copywriterperate machinery -Drink alcoholic beverages -Take any medication unless instructed  by your physician -Make any legal decisions or sign important papers.  Meals: Start with liquid foods such as gelatin or soup. Progress to regular foods as tolerated. Avoid greasy, spicy, heavy foods. If nausea and/or vomiting occur, drink only clear liquids until the nausea and/or vomiting subsides. Call your physician if vomiting continues.  Special Instructions/Symptoms: Your throat may feel dry or sore from the anesthesia or the breathing tube placed in your throat during surgery. If this causes discomfort, gargle with warm salt water. The discomfort should disappear within 24 hours.  If you had a scopolamine patch placed behind your ear for the management of post- operative nausea and/or vomiting:  1. The medication in the patch is effective for 72 hours, after which it should be removed.  Wrap patch in a tissue and discard in the trash. Wash hands thoroughly with soap and water. 2. You may remove the patch earlier than 72 hours if you experience unpleasant side effects which may include dry mouth, dizziness or visual disturbances. 3. Avoid touching the patch. Wash your hands with soap and water after contact with the patch.   Regional Anesthesia Blocks  1. Numbness or the inability to  move the "blocked" extremity may last from 3-48 hours after placement. The length of time depends on the medication injected and your individual response to the medication. If the numbness is not going away after 48 hours, call your surgeon.  2. The extremity that is blocked will need to be protected until the numbness is gone and the  Strength has returned. Because you cannot feel it, you will need to take extra care to avoid injury. Because it may be weak, you may have difficulty moving it or using it. You may not know what position it is in without looking at it while the block is in effect.  3. For blocks in the legs and feet, returning to weight bearing and walking needs to be done carefully. You will  need to wait until the numbness is entirely gone and the strength has returned. You should be able to move your leg and foot normally before you try and bear weight or walk. You will need someone to be with you when you first try to ensure you do not fall and possibly risk injury.  4. Bruising and tenderness at the needle site are common side effects and will resolve in a few days.  5. Persistent numbness or new problems with movement should be communicated to the surgeon or the [facility] [phone] [facility] [phone]).

## 2017-02-17 NOTE — Transfer of Care (Signed)
Immediate Anesthesia Transfer of Care Note  Patient: Shannon Adkins  Procedure(s) Performed: Procedure(s): OPEN REDUCTION INTERNAL FIXATION (ORIF) WRIST FRACTURE (Right)  Patient Location: PACU  Anesthesia Type:GA combined with regional for post-op pain  Level of Consciousness: sedated  Airway & Oxygen Therapy: Patient Spontanous Breathing and Patient connected to face mask oxygen  Post-op Assessment: Report given to RN and Post -op Vital signs reviewed and stable  Post vital signs: Reviewed and stable  Last Vitals:  Vitals:   02/17/17 1400 02/17/17 1535  BP:    Pulse: 67   Resp: 17   Temp:  36.5 C  SpO2: 100%     Last Pain:  Vitals:   02/17/17 1223  TempSrc: Oral  PainSc: 7       Patients Stated Pain Goal: 5 (02/17/17 1223)  Complications: No apparent anesthesia complications

## 2017-02-17 NOTE — Op Note (Signed)
02/17/2017  3:09 PM  PATIENT:  Shannon Adkins    PRE-OPERATIVE DIAGNOSIS:  RIGHT WRIST FRACTURE  POST-OPERATIVE DIAGNOSIS:  Same  PROCEDURE:  OPEN REDUCTION INTERNAL FIXATION (ORIF) WRIST FRACTURE  SURGEON:  Rodnisha Blomgren, Jewel BaizeIMOTHY D, MD  ASSISTANT: Aquilla HackerHenry Martensen, PA-C, he was present and scrubbed throughout the case, critical for completion in a timely fashion, and for retraction, instrumentation, and closure.   ANESTHESIA:   gen  PREOPERATIVE INDICATIONS:  Shannon HaffDianne Adkins is a  66 y.o. female with a diagnosis of RIGHT WRIST FRACTURE who failed conservative measures and elected for surgical management.    The risks benefits and alternatives were discussed with the patient preoperatively including but not limited to the risks of infection, bleeding, nerve injury, cardiopulmonary complications, the need for revision surgery, among others, and the patient was willing to proceed.  OPERATIVE IMPLANTS: DVR plate  OPERATIVE FINDINGS: significant articular comminution  BLOOD LOSS: min  COMPLICATIONS: none  TOURNIQUET TIME: 60min  OPERATIVE PROCEDURE:  Patient was identified in the preoperative holding area and site was marked by me She was transported to the operating theater and placed on the table in supine position taking care to pad all bony prominences. After a preincinduction time out anesthesia was induced. The right upper extremity was prepped and draped in normal sterile fashion and a pre-incision timeout was performed. She received ancef for preoperative antibiotics.   I made a 5 cm incision centered over her FCR tendon and dissected down carefully to the level of the flexor tendon sheath and incise this longitudinally and retracted the FCR radially and incised the dorsal aspect of the sheath.   I bluntly dissected the FPL muscle belly away from the brachioradialis and then sharply incised the pronator tendon from the distal radius and from the wrist capsule. I Elevated this off  the bone the fractures visible.   I released the brachioradialis from its insertion. I then debrided the fracture and performed a manual reduction.   I selected a plate and I placed it on the bone. I pinned it into place and was happy on multiple radiographic views with it's placement. I then fixed the plate distally with the locking pegs. I confirmed no articular penetration with the pegs and that none were prominent dorsally.   I then reduced the plate to the shaft improving the volar and radial tilt of her distal radius.  I was happy with the final fluoro xrays    I thoroughly irrigated the wound and closed the pronator over top of the plate and then closed the skin in layers with absorbable stitch. Sterile dressing was applied using the PACU in stable condition.  POST OPERATIVE PLAN: NWB, Splint full time. Ambulate for DVT px.

## 2017-02-17 NOTE — Progress Notes (Signed)
Assisted Dr. Ellender with right, ultrasound guided, supraclavicular block. Side rails up, monitors on throughout procedure. See vital signs in flow sheet. Tolerated Procedure well. 

## 2017-02-18 ENCOUNTER — Encounter (HOSPITAL_BASED_OUTPATIENT_CLINIC_OR_DEPARTMENT_OTHER): Payer: Self-pay | Admitting: Orthopedic Surgery

## 2017-03-24 ENCOUNTER — Ambulatory Visit: Payer: BLUE CROSS/BLUE SHIELD | Admitting: Family Medicine

## 2017-04-07 ENCOUNTER — Other Ambulatory Visit: Payer: Self-pay | Admitting: Family Medicine

## 2017-04-11 ENCOUNTER — Encounter: Payer: Self-pay | Admitting: Family Medicine

## 2017-04-11 ENCOUNTER — Ambulatory Visit (INDEPENDENT_AMBULATORY_CARE_PROVIDER_SITE_OTHER): Payer: BLUE CROSS/BLUE SHIELD | Admitting: Family Medicine

## 2017-04-11 VITALS — BP 118/70 | HR 68 | Temp 98.0°F | Resp 16 | Ht 67.32 in | Wt 143.0 lb

## 2017-04-11 DIAGNOSIS — Z1231 Encounter for screening mammogram for malignant neoplasm of breast: Secondary | ICD-10-CM

## 2017-04-11 DIAGNOSIS — K582 Mixed irritable bowel syndrome: Secondary | ICD-10-CM | POA: Diagnosis not present

## 2017-04-11 DIAGNOSIS — F419 Anxiety disorder, unspecified: Secondary | ICD-10-CM | POA: Diagnosis not present

## 2017-04-11 DIAGNOSIS — F32A Depression, unspecified: Secondary | ICD-10-CM

## 2017-04-11 DIAGNOSIS — K219 Gastro-esophageal reflux disease without esophagitis: Secondary | ICD-10-CM | POA: Diagnosis not present

## 2017-04-11 DIAGNOSIS — E034 Atrophy of thyroid (acquired): Secondary | ICD-10-CM

## 2017-04-11 DIAGNOSIS — Z23 Encounter for immunization: Secondary | ICD-10-CM

## 2017-04-11 DIAGNOSIS — F329 Major depressive disorder, single episode, unspecified: Secondary | ICD-10-CM | POA: Diagnosis not present

## 2017-04-11 DIAGNOSIS — Z1239 Encounter for other screening for malignant neoplasm of breast: Secondary | ICD-10-CM

## 2017-04-11 DIAGNOSIS — J301 Allergic rhinitis due to pollen: Secondary | ICD-10-CM | POA: Diagnosis not present

## 2017-04-11 MED ORDER — FLUTICASONE PROPIONATE 50 MCG/ACT NA SUSP: 2.0000 | Freq: Every day | NASAL | 11 refills | Status: DC

## 2017-04-11 MED ORDER — DULOXETINE HCL 60 MG PO CPEP: 60.0000 mg | ORAL_CAPSULE | Freq: Every day | ORAL | 1 refills | Status: DC

## 2017-04-11 MED ORDER — OMEPRAZOLE 20 MG PO CPDR: 20.0000 mg | DELAYED_RELEASE_CAPSULE | Freq: Every day | ORAL | 3 refills | Status: DC

## 2017-04-11 MED ORDER — MONTELUKAST SODIUM 10 MG PO TABS: 10.0000 mg | ORAL_TABLET | Freq: Every day | ORAL | 3 refills | Status: DC

## 2017-04-11 MED ORDER — THYROID 65 MG PO TABS: 65.0000 mg | ORAL_TABLET | Freq: Every day | ORAL | 3 refills | Status: DC

## 2017-04-11 NOTE — Patient Instructions (Signed)
     IF you received an x-ray today, you will receive an invoice from [facility]. Please contact [facility] at [phone] with questions or concerns regarding your invoice.   IF you received labwork today, you will receive an invoice from [facility]. Please contact [facility] at [phone] with questions or concerns regarding your invoice.   Our billing staff will not be able to assist you with questions regarding bills from these companies.  You will be contacted with the lab results as soon as they are available. The fastest way to get your results is to activate your My Chart account. Instructions are located on the last page of this paperwork. If you have not heard from us regarding the results in 2 weeks, please contact this office.     
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
## 2017-04-12 LAB — TSH: TSH: 3.89 u[IU]/mL (ref 0.450–4.500)

## 2017-04-12 LAB — T4, FREE: Free T4: 0.94 ng/dL (ref 0.82–1.77)
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
## 2017-05-18 ENCOUNTER — Ambulatory Visit: Payer: BLUE CROSS/BLUE SHIELD

## 2017-05-19 ENCOUNTER — Encounter: Payer: Self-pay | Admitting: Family Medicine

## 2017-05-20 ENCOUNTER — Encounter: Payer: Self-pay | Admitting: Family Medicine

## 2017-05-22 ENCOUNTER — Telehealth: Payer: Self-pay | Admitting: Family Medicine

## 2017-05-22 NOTE — Telephone Encounter (Signed)
See request °

## 2017-05-22 NOTE — Telephone Encounter (Signed)
Copied from CRM [id number]. Topic: Quick Communication - See Telephone Encounter >> May 22, 2017 11:52 AM Everardo PacificMoton, Leasa Kincannon, NT wrote: CRM for notification. See Telephone encounter for: Pharmacy called because the patient needs a refill on her Cymbalta.Patient stated that she has lost her medication. If someone could give them a call back at [phone]  05/22/17.

## 2017-05-24 ENCOUNTER — Other Ambulatory Visit: Payer: Self-pay | Admitting: Family Medicine

## 2017-05-24 MED ORDER — OMEPRAZOLE 20 MG PO CPDR: 20.0000 mg | DELAYED_RELEASE_CAPSULE | Freq: Every day | ORAL | 3 refills | Status: DC

## 2017-05-24 MED ORDER — DULOXETINE HCL 60 MG PO CPEP: 60.0000 mg | ORAL_CAPSULE | Freq: Every day | ORAL | 1 refills | Status: DC

## 2017-05-24 MED ORDER — FLUTICASONE PROPIONATE 50 MCG/ACT NA SUSP: 2.0000 | Freq: Every day | NASAL | 3 refills | Status: DC

## 2017-10-12 ENCOUNTER — Other Ambulatory Visit: Payer: Self-pay

## 2017-10-12 ENCOUNTER — Encounter: Payer: Self-pay | Admitting: Family Medicine

## 2017-10-12 ENCOUNTER — Ambulatory Visit: Payer: BLUE CROSS/BLUE SHIELD | Admitting: Family Medicine

## 2017-10-12 VITALS — BP 110/70 | HR 72 | Temp 98.0°F | Resp 16 | Ht 67.52 in | Wt 142.0 lb

## 2017-10-12 DIAGNOSIS — E034 Atrophy of thyroid (acquired): Secondary | ICD-10-CM

## 2017-10-12 DIAGNOSIS — F419 Anxiety disorder, unspecified: Secondary | ICD-10-CM | POA: Diagnosis not present

## 2017-10-12 DIAGNOSIS — F329 Major depressive disorder, single episode, unspecified: Secondary | ICD-10-CM

## 2017-10-12 DIAGNOSIS — K582 Mixed irritable bowel syndrome: Secondary | ICD-10-CM | POA: Diagnosis not present

## 2017-10-12 DIAGNOSIS — K219 Gastro-esophageal reflux disease without esophagitis: Secondary | ICD-10-CM | POA: Diagnosis not present

## 2017-10-12 DIAGNOSIS — F32A Depression, unspecified: Secondary | ICD-10-CM

## 2017-10-12 DIAGNOSIS — F40248 Other situational type phobia: Secondary | ICD-10-CM

## 2017-10-12 DIAGNOSIS — B001 Herpesviral vesicular dermatitis: Secondary | ICD-10-CM | POA: Diagnosis not present

## 2017-10-12 DIAGNOSIS — J301 Allergic rhinitis due to pollen: Secondary | ICD-10-CM

## 2017-10-12 DIAGNOSIS — X32XXXA Exposure to sunlight, initial encounter: Secondary | ICD-10-CM

## 2017-10-12 DIAGNOSIS — L570 Actinic keratosis: Secondary | ICD-10-CM

## 2017-10-12 DIAGNOSIS — F4010 Social phobia, unspecified: Secondary | ICD-10-CM

## 2017-10-12 MED ORDER — DULOXETINE HCL 60 MG PO CPEP
60.0000 mg | ORAL_CAPSULE | Freq: Every day | ORAL | 1 refills | Status: DC
Start: 2017-10-12 — End: 2018-01-18

## 2017-10-12 MED ORDER — DULOXETINE HCL 30 MG PO CPEP: 30.0000 mg | ORAL_CAPSULE | Freq: Every day | ORAL | 1 refills | Status: DC

## 2017-10-12 MED ORDER — TRETINOIN 0.025 % EX GEL: Freq: Every day | CUTANEOUS | 3 refills | Status: DC

## 2017-10-12 NOTE — Patient Instructions (Addendum)
  [facility] AT [facility] as of August 2019.    Benny Lennert, PA-C Irma Leona Carry, MD   IF you received an x-ray today, you will receive an invoice from [facility]. Please contact [facility] at [phone] with questions or concerns regarding your invoice.   IF you received labwork today, you will receive an invoice from [facility]. Please contact [facility] at [phone] with questions or concerns regarding your invoice.   Our billing staff will not be able to assist you with questions regarding bills from these companies.  You will be contacted with the lab results as soon as they are available. The fastest way to get your results is to activate your My Chart account. Instructions are located on the last page of this paperwork. If you have not heard from [place] regarding the results in 2 weeks, please contact this office.

## 2017-10-12 NOTE — Progress Notes (Signed)
Subjective:    Patient ID: Shannon Adkins, female    DOB: Apr 11, 1951, 68 y.o.   MRN: [id number]  10/12/2017  Chronic Conditions (6 month follow-up )    HPI This 67 y.o. female presents for six month follow-up of anxiety/depression, hypothyroidism, IBS.  Management changes made at last visit include the following:  Anxiety and depression better controlled with Cymbalta therapy.  No changes to management made at this time.  Many family stressors have improved which is helpful as well with depressive and anxiety symptoms.  IBS symptoms have greatly improved with avoiding dairy.  Patient is feeling much better.  Hyoscyamine therapy also beneficial with an acute flare of IBS symptoms.  Continue current therapy.  Hypothyroidism well controlled.  Obtain labs for chronic disease management.  Allergic Rhinitis controlled; refills provided.   Refer for screening mammogram.  Status post influenza vaccine in office.   UPDATE: Range of motion improved. Down mood last few days; more anxiety most of the time. Nightime awakening.  Mind racing.   Having a lot of problems that needing solving.   Stopped Prilosec; tolerating moderately well.  Doing fine. Buccini was wonderful.  Align recommended has been wonderful.  [employer].    B knee pain; starting to do yoga; recommends standing better.   Has scoliosis.   Taking supplements of iron and B12. Eats a lot of greens.  No dairy at all.    BP Readings from Last 3 Encounters:  10/12/17 110/70  04/11/17 118/70  02/17/17 110/74   Wt Readings from Last 3 Encounters:  10/12/17 142 lb (64.4 kg)  04/11/17 143 lb (64.9 kg)  02/17/17 141 lb 4 oz (64.1 kg)   Immunization History  Administered Date(s) Administered  . Influenza,inj,Quad PF,6+ Mos 02/25/2014, 06/11/2015, 04/11/2017  . Influenza-Unspecified 07/03/2016  . Td 06/09/1996  . Tdap 02/25/2014    Review of Systems  Constitutional: Negative for chills, diaphoresis,  fatigue and fever.  Eyes: Negative for visual disturbance.  Respiratory: Negative for cough and shortness of breath.   Cardiovascular: Negative for chest pain, palpitations and leg swelling.  Gastrointestinal: Positive for abdominal distention, abdominal pain and constipation. Negative for anal bleeding, blood in stool, diarrhea, nausea, rectal pain and vomiting.  Endocrine: Negative for cold intolerance, heat intolerance, polydipsia, polyphagia and polyuria.  Musculoskeletal: Positive for arthralgias.  Neurological: Negative for dizziness, tremors, seizures, syncope, facial asymmetry, speech difficulty, weakness, light-headedness, numbness and headaches.  Psychiatric/Behavioral: Positive for dysphoric mood and sleep disturbance. Negative for self-injury and suicidal ideas. The patient is nervous/anxious.     Past Medical History:  Diagnosis Date  . Allergy    Allegra, Flonase  . Anxiety   . GERD (gastroesophageal reflux disease)   . Herpes labialis   . Hypothyroidism   . IBS (irritable bowel syndrome)   . Right wrist fracture   . Thyroid disease    Past Surgical History:  Procedure Laterality Date  . CESAREAN SECTION     x 1  . HERNIA REPAIR Left   . ORIF WRIST FRACTURE Right 02/17/2017   Procedure: OPEN REDUCTION INTERNAL FIXATION (ORIF) WRIST FRACTURE;  Surgeon: Sheral Apley, MD;  Location: [facility];  Service: Orthopedics;  Laterality: Right;  . TUBAL LIGATION     Allergies  Allergen Reactions  . Amoxicillin Hives    Pt called in to report on 07/31/11  . Lactose Intolerance (Gi)   . Penicillins Hives  . Sulfamethoxazole-Trimethoprim     REACTION: conjunctivitis   Current Outpatient  Medications on File Prior to Visit  Medication Sig Dispense Refill  . Ascorbic Acid (VITAMIN C) 1000 MG tablet Take 1,000 mg by mouth daily.    Marland Kitchen b complex vitamins tablet Take 1 tablet by mouth daily.    . fluticasone (FLONASE) 50 MCG/ACT nasal spray Place 2 sprays into  both nostrils daily. 48 g 3  . thyroid (NATURE-THROID) 65 MG tablet Take 1 tablet (65 mg total) by mouth daily. 90 tablet 3   No current facility-administered medications on file prior to visit.    Social History   Socioeconomic History  . Marital status: Married    Spouse name: Not on file  . Number of children: 3  . Years of education: Not on file  . Highest education level: Not on file  Occupational History  . Not on file  Social Needs  . Financial resource strain: Not on file  . Food insecurity:    Worry: Not on file    Inability: Not on file  . Transportation needs:    Medical: Not on file    Non-medical: Not on file  Tobacco Use  . Smoking status: Never Smoker  . Smokeless tobacco: Never Used  Substance and Sexual Activity  . Alcohol use: Yes    Alcohol/week: 0.6 oz    Types: 1 Glasses of wine per week    Comment: social  . Drug use: No  . Sexual activity: Yes  Lifestyle  . Physical activity:    Days per week: Not on file    Minutes per session: Not on file  . Stress: Not on file  Relationships  . Social connections:    Talks on phone: Not on file    Gets together: Not on file    Attends religious service: Not on file    Active member of club or organization: Not on file    Attends meetings of clubs or organizations: Not on file    Relationship status: Not on file  . Intimate partner violence:    Fear of current or ex partner: Not on file    Emotionally abused: Not on file    Physically abused: Not on file    Forced sexual activity: Not on file  Other Topics Concern  . 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 Osteoporosis Mother   . Hypothyroidism Mother   . Alzheimer's disease Mother   . Hypertension Father   . Hypothyroidism Father   . Stroke Father   . Hypothyroidism Sister   . Angioedema Sister   . Hypothyroidism Sister   . Angioedema Sister   . Hypothyroidism Sister   . Angioedema Sister        Objective:    BP 110/70   Pulse 72   Temp 98 F (36.7 C) (Oral)   Resp 16   Ht 5' 7.52" (1.715 m)   Wt 142 lb (64.4 kg)   SpO2 96%   BMI 21.90  kg/m  Physical Exam  Constitutional: She is oriented to person, place, and time. She appears well-developed and well-nourished. No distress.  HENT:  Head: Normocephalic and atraumatic.  Right Ear: External ear normal.  Left Ear: External ear normal.  Nose: Nose normal.  Mouth/Throat: Oropharynx is clear and moist.  Eyes: Pupils are equal, round, and reactive to light. Conjunctivae and EOM are normal.  Neck: Normal range of motion and full passive range of motion without pain. Neck supple. No JVD present. Carotid bruit is not present. No thyromegaly present.  Cardiovascular: Normal rate, regular rhythm and normal heart sounds. Exam reveals no gallop and no friction rub.  No murmur heard. Pulmonary/Chest: Effort normal and breath sounds normal. She has no wheezes. She has no rales.  Abdominal: Soft. Bowel sounds are normal. She exhibits no distension and no mass. There is no tenderness. There is no rebound and no guarding.  Musculoskeletal:       Right shoulder: Normal.       Left shoulder: Normal.       Cervical back: Normal.  Lymphadenopathy:    She has no cervical adenopathy.  Neurological: She is alert and oriented to person, place, and time. She has normal reflexes. No cranial nerve deficit. She exhibits normal muscle tone. Coordination normal.  Skin: Skin is warm and dry. No rash noted. She is not diaphoretic. No erythema. No pallor.  Psychiatric: She has a normal mood  and affect. Her behavior is normal. Judgment and thought content normal.  Nursing note and vitals reviewed.  No results found. Depression screen [facility] 2/9 10/12/2017 04/11/2017 10/02/2016 01/08/2016 01/08/2016  Decreased Interest 0 0 0 0 0  Down, Depressed, Hopeless 0 0 0 0 0  PHQ - 2 Score 0 0 0 0 0   Fall Risk  10/12/2017 04/11/2017 10/02/2016 01/08/2016 01/08/2016  Falls in the past year? No Yes No No No  Number falls in past yr: - 1 - - -  Injury with Fall? - Yes - - -        Assessment & Plan:   1. Anxiety and depression   2. Irritable bowel syndrome with both constipation and diarrhea   3. Seasonal allergic rhinitis due to pollen   4. Gastroesophageal reflux disease without esophagitis   5. Fear of public speaking   6. Herpes labialis   7. Hypothyroidism due to acquired atrophy of thyroid     Anxiety and depression: moderately controlled yet ongoing anxiety; increase Cymbalta to  daily.  Encourage daily exercise for anxiety management.  IBS and GERD: s/p consultation with Dr. Matthias Hughs; doing well at this time with adjustments to regimen.  Hypothyroidism: stable at this time.  Sun keratosis: New; rx for Retina-A.   No orders of the defined types were placed in this encounter.  Meds ordered this encounter  Medications  . DULoxetine (CYMBALTA) 30 MG capsule    Sig: Take 1 capsule (30 mg total) by mouth daily.    Dispense:  90 capsule    Refill:  1  . DULoxetine (CYMBALTA) 60 MG capsule    Sig: Take 1 capsule (60 mg total) by mouth daily.    Dispense:  90 capsule    Refill:  1  . tretinoin (RETIN-A) 0.025 % gel    Sig: Apply topically at bedtime.    Dispense:  45 g    Refill:  3    Return in about 6 months (around 04/14/2018) for complete physical examiniation SANTIAGO OR WEBER.   Darcey Demma  Paulita Fujita, M.D. Primary Care at [facility] previously [facility] [address] [place], [place]  [address] [phone] phone [phone] fax

## 2017-10-15 ENCOUNTER — Telehealth: Payer: Self-pay

## 2017-10-15 NOTE — Telephone Encounter (Signed)
Received PA for tretinoin gel. Unable to complete prior auth because not is incomplete from [facility]OV. Please advise.

## 2017-11-03 ENCOUNTER — Encounter: Payer: Self-pay | Admitting: Family Medicine

## 2017-12-27 DIAGNOSIS — E034 Atrophy of thyroid (acquired): Secondary | ICD-10-CM

## 2017-12-27 HISTORY — DX: Atrophy of thyroid (acquired): E03.4

## 2017-12-30 NOTE — Telephone Encounter (Signed)
Note completed; please process prior authorization.

## 2018-01-11 ENCOUNTER — Encounter: Payer: Self-pay | Admitting: Family Medicine

## 2018-01-18 ENCOUNTER — Other Ambulatory Visit: Payer: Self-pay

## 2018-01-18 ENCOUNTER — Ambulatory Visit (INDEPENDENT_AMBULATORY_CARE_PROVIDER_SITE_OTHER): Payer: Medicare HMO | Admitting: Family Medicine

## 2018-01-18 ENCOUNTER — Encounter: Payer: Self-pay | Admitting: Family Medicine

## 2018-01-18 VITALS — BP 108/66 | HR 82 | Temp 98.0°F | Resp 18 | Ht 67.52 in | Wt 139.2 lb

## 2018-01-18 DIAGNOSIS — F419 Anxiety disorder, unspecified: Secondary | ICD-10-CM

## 2018-01-18 DIAGNOSIS — F515 Nightmare disorder: Secondary | ICD-10-CM | POA: Diagnosis not present

## 2018-01-18 DIAGNOSIS — F329 Major depressive disorder, single episode, unspecified: Secondary | ICD-10-CM

## 2018-01-18 DIAGNOSIS — B009 Herpesviral infection, unspecified: Secondary | ICD-10-CM | POA: Diagnosis not present

## 2018-01-18 DIAGNOSIS — F32A Depression, unspecified: Secondary | ICD-10-CM

## 2018-01-18 DIAGNOSIS — R69 Illness, unspecified: Secondary | ICD-10-CM | POA: Diagnosis not present

## 2018-01-18 MED ORDER — VALACYCLOVIR HCL 1 G PO TABS: 1000.0000 mg | ORAL_TABLET | Freq: Every day | ORAL | 3 refills | Status: AC

## 2018-01-18 MED ORDER — DULOXETINE HCL 60 MG PO CPEP: 60.0000 mg | ORAL_CAPSULE | Freq: Every day | ORAL | 1 refills | Status: DC

## 2018-01-18 MED ORDER — PRAZOSIN HCL 1 MG PO CAPS: 1.0000 mg | ORAL_CAPSULE | Freq: Every day | ORAL | 2 refills | Status: DC

## 2018-01-18 NOTE — Progress Notes (Signed)
8/12/201911:16 AM  Shannon Adkins 12/20/1950, 67 y.o. female [id number]  Chief Complaint  Patient presents with  . fever blisters  . Establish Care    HPI:   Patient is a 67 y.o. female with past medical history significant for anxiety, depression, hypothyroidism, IBS and OA of knees who presents today to establish care  Patient with concerns for recurring herpes outbreaks - labialis and genital Had not had one in years and now suddenly has about 3 Has been using essential oils for comfort  She states that stressors are sign better, back on 60mg  of duloxetine, doing well, does not want to remain on 90mg   She is having issues with energy and sleep Needs to get 10 hour of sleep if not really tired during the day She denies any problems with initiation of sleep She does have issues with remaining asleep, reports her mind turns on Has recurring nightmares, same themes of not being able to accomplish what is needed, being humiliated, had a very strict father She awakens tense, shaking, sweating She snores occ, very mildly, denies any restless movements Wakes up feeling refreshed and wo headaches  Fall Risk  01/18/2018 10/12/2017 04/11/2017 10/02/2016 01/08/2016  Falls in the past year? Yes No Yes No No  Number falls in past yr: 1 - 1 - -  Injury with Fall? Yes - Yes - -  Comment broke wrist  - - - -     [facility] 01/18/2018 10/12/2017 04/11/2017  Decreased Interest 0 0 0  Down, Depressed, Hopeless 0 0 0  PHQ - 2 Score 0 0 0    Allergies  Allergen Reactions  . Amoxicillin Hives    Pt called in to report on 07/31/11  . Lactose Intolerance (Gi)   . Penicillins Hives  . Sulfamethoxazole-Trimethoprim     REACTION: conjunctivitis    Prior to Admission medications   Medication Sig Start Date End Date Taking? Authorizing Provider  Ascorbic Acid (VITAMIN C) 1000 MG tablet Take 1,000 mg by mouth daily.   Yes [provider]  b complex vitamins tablet Take 1  tablet by mouth daily.   Yes [provider]  fluticasone (FLONASE) 50 MCG/ACT nasal spray Place 2 sprays into both nostrils daily. 05/24/17  Yes Ethelda ChickSmith, Kristi M, MD  thyroid (NATURE-THROID) 65 MG tablet Take 1 tablet (65 mg total) by mouth daily. 04/11/17  Yes Ethelda ChickSmith, Kristi M, MD  tretinoin (RETIN-A) 0.025 % gel Apply topically at bedtime. 10/12/17  Yes Ethelda ChickSmith, Kristi M, MD  DULoxetine (CYMBALTA) 30 MG capsule Take 1 capsule (30 mg total) by mouth daily. Patient not taking: Reported on 01/18/2018 10/12/17   Ethelda ChickSmith, Kristi M, MD  DULoxetine (CYMBALTA) 60 MG capsule Take 1 capsule (60 mg total) by mouth daily. Patient not taking: Reported on 01/18/2018 10/12/17   Ethelda ChickSmith, Kristi M, MD    Past Medical History:  Diagnosis Date  . Allergy    Allegra, Flonase  . Anxiety   . GERD (gastroesophageal reflux disease)   . Herpes labialis   . Hypothyroidism   . IBS (irritable bowel syndrome)   . Right wrist fracture   . Thyroid disease     Past Surgical History:  Procedure Laterality Date  . CESAREAN SECTION     x 1  . HERNIA REPAIR Left   . ORIF WRIST FRACTURE Right 02/17/2017   Procedure: OPEN REDUCTION INTERNAL FIXATION (ORIF) WRIST FRACTURE;  Surgeon: Sheral ApleyMurphy, Timothy D, MD;  Location: [facility];  Service: Orthopedics;  Laterality: Right;  . TUBAL LIGATION      Social History   Tobacco Use  . Smoking status: Never Smoker  . Smokeless tobacco: Never Used  Substance Use Topics  . Alcohol use: Yes    Alcohol/week: 1.0 standard drinks    Types: 1 Glasses of wine per week    Comment: social    Family History  Problem Relation Age of Onset  . Osteoporosis Mother   . Hypothyroidism Mother   . Alzheimer's disease Mother   . Hypertension Father   . Hypothyroidism Father   . Stroke Father   . Hypothyroidism Sister   . Angioedema Sister   . Hypothyroidism Sister   . Angioedema Sister   . Hypothyroidism Sister   . Angioedema Sister     Review of Systems    Constitutional: Positive for diaphoresis and malaise/fatigue.  Skin: Positive for rash.  Neurological: Negative for headaches.  Psychiatric/Behavioral: Negative for memory loss. The patient is nervous/anxious and has insomnia.    Per hpi  OBJECTIVE:  Blood pressure 108/66, pulse 82, temperature 98 F (36.7 C), temperature source Oral, resp. rate 18, height 5' 7.52" (1.715 m), weight 139 lb 3.2 oz (63.1 kg), SpO2 98 %. Body mass index is 21.47 kg/m.   Physical Exam  Constitutional: She is oriented to person, place, and time. She appears well-developed and well-nourished.  HENT:  Head: Normocephalic and atraumatic.  Mouth/Throat: Mucous membranes are normal.  Eyes: Pupils are equal, round, and reactive to light. EOM are normal. No scleral icterus.  Neck: Neck supple.  Pulmonary/Chest: Effort normal.  Neurological: She is alert and oriented to person, place, and time.  Skin: Skin is warm and dry.  Nursing note and vitals reviewed.   tsh in nov 2018 at goal  ASSESSMENT and PLAN  1. Anxiety and depression Controlled. Continue current regime.  - Care order/instruction:  2. Nightmares Discussed treatment options, will do trial of prazosin, new med r/se/b reviewed  3. Herpes simplex infection Will start treatment of current flare, consider suppressive therapy  Other orders - valACYclovir (VALTREX) 1000 MG tablet; Take 1 tablet (1,000 mg total) by mouth daily for 5 days. - prazosin (MINIPRESS) 1 MG capsule; Take 1 capsule (1 mg total) by mouth at bedtime. - DULoxetine (CYMBALTA) 60 MG capsule; Take 1 capsule (60 mg total) by mouth daily.  Return in about 4 weeks (around 02/15/2018).    Myles LippsIrma M Santiago, MD Primary Care at [facility] [address] [place], [place] [address] Ph.  [phone] Fax [phone]

## 2018-01-18 NOTE — Patient Instructions (Signed)
     IF you received an x-ray today, you will receive an invoice from [facility]. Please contact [facility] at [phone] with questions or concerns regarding your invoice.   IF you received labwork today, you will receive an invoice from [facility]. Please contact [facility] at [phone] with questions or concerns regarding your invoice.   Our billing staff will not be able to assist you with questions regarding bills from these companies.  You will be contacted with the lab results as soon as they are available. The fastest way to get your results is to activate your My Chart account. Instructions are located on the last page of this paperwork. If you have not heard from us regarding the results in 2 weeks, please contact this office.     

## 2018-01-19 ENCOUNTER — Telehealth: Payer: Self-pay | Admitting: Family Medicine

## 2018-01-19 NOTE — Telephone Encounter (Signed)
Copied from CRM [id number]. Topic: Quick Communication - Rx Refill/Question >> Jan 19, 2018 12:40 PM Darletta MollLander, Lumin L wrote: Medication: valcyclovir  Has the patient contacted their pharmacy? Yes.   (Agent: If no, request that the patient contact the pharmacy for the refill.) (Agent: If yes, when and what did the pharmacy advise?)  Preferred Pharmacy (with phone number or street name): [employer] needs to know if patient tried any other scripts than Valcyclovir? Need additional information regarding the patient taking it. Please call back to assist. TOPIC [id number] Urgent   Agent: Please be advised that RX refills may take up to 3 business days. We ask that you follow-up with your pharmacy.

## 2018-01-26 ENCOUNTER — Other Ambulatory Visit: Payer: Self-pay

## 2018-01-26 DIAGNOSIS — B009 Herpesviral infection, unspecified: Secondary | ICD-10-CM

## 2018-01-26 MED ORDER — VALACYCLOVIR HCL 1 G PO TABS
1000.0000 mg | ORAL_TABLET | Freq: Two times a day (BID) | ORAL | 3 refills | Status: DC
Start: 2018-01-26 — End: 2018-02-25

## 2018-01-26 NOTE — Telephone Encounter (Signed)
Rx was refilled and sent to pharmacy today.

## 2018-02-04 DIAGNOSIS — M9903 Segmental and somatic dysfunction of lumbar region: Secondary | ICD-10-CM | POA: Diagnosis not present

## 2018-02-04 DIAGNOSIS — M9902 Segmental and somatic dysfunction of thoracic region: Secondary | ICD-10-CM | POA: Diagnosis not present

## 2018-02-04 DIAGNOSIS — M9901 Segmental and somatic dysfunction of cervical region: Secondary | ICD-10-CM | POA: Diagnosis not present

## 2018-02-04 DIAGNOSIS — M9905 Segmental and somatic dysfunction of pelvic region: Secondary | ICD-10-CM | POA: Diagnosis not present

## 2018-02-25 ENCOUNTER — Ambulatory Visit (INDEPENDENT_AMBULATORY_CARE_PROVIDER_SITE_OTHER): Payer: Medicare HMO | Admitting: Family Medicine

## 2018-02-25 ENCOUNTER — Ambulatory Visit: Payer: BLUE CROSS/BLUE SHIELD | Admitting: Family Medicine

## 2018-02-25 ENCOUNTER — Encounter: Payer: Self-pay | Admitting: Family Medicine

## 2018-02-25 VITALS — BP 110/74 | HR 72 | Temp 97.9°F | Ht 67.75 in | Wt 141.0 lb

## 2018-02-25 DIAGNOSIS — F419 Anxiety disorder, unspecified: Secondary | ICD-10-CM

## 2018-02-25 DIAGNOSIS — F515 Nightmare disorder: Secondary | ICD-10-CM | POA: Diagnosis not present

## 2018-02-25 DIAGNOSIS — B009 Herpesviral infection, unspecified: Secondary | ICD-10-CM

## 2018-02-25 DIAGNOSIS — R69 Illness, unspecified: Secondary | ICD-10-CM | POA: Diagnosis not present

## 2018-02-25 DIAGNOSIS — F329 Major depressive disorder, single episode, unspecified: Secondary | ICD-10-CM | POA: Diagnosis not present

## 2018-02-25 DIAGNOSIS — F32A Depression, unspecified: Secondary | ICD-10-CM

## 2018-02-25 MED ORDER — DULOXETINE HCL 60 MG PO CPEP: 60.0000 mg | ORAL_CAPSULE | Freq: Every day | ORAL | 1 refills | Status: DC

## 2018-02-25 MED ORDER — VALACYCLOVIR HCL 500 MG PO TABS: 1000.0000 mg | ORAL_TABLET | Freq: Two times a day (BID) | ORAL | 1 refills | Status: DC

## 2018-02-25 MED ORDER — TRAZODONE HCL 50 MG PO TABS: 25.0000 mg | ORAL_TABLET | Freq: Every evening | ORAL | 3 refills | Status: DC | PRN

## 2018-02-25 NOTE — Progress Notes (Signed)
9/19/20194:03 PM  Shannon Adkins 01-01-1951, 67 y.o. female [id number]  Chief Complaint  Patient presents with  . Anxiety    did not take the minipress for the nightmares, medication is too expensive. Requesting alternative  . Depression    HPI:   Patient is a 67 y.o. female with past medical history significant for anxiety, depression, hypothyroidism, IBS and OA of knees who presents today for follow-up  All 3 medications were tier 3 which is $47 per prescription prasozin is too expensive and she also had concerns with orthostatic hypotension Has never tried anything for sleep other than herbal supplements such as valerian, kava kava and melatonin PHq9 = 4 GAD 7 = 6  Took valacyclovir x 5 days but then had another outbreak Has been having recurring oubreaks for months  She will  get the flu vaccine at her pharmacy  Fall Risk  01/18/2018 10/12/2017 04/11/2017 10/02/2016 01/08/2016  Falls in the past year? Yes No Yes No No  Number falls in past yr: 1 - 1 - -  Injury with Fall? Yes - Yes - -  Comment broke wrist  - - - -     Depression screen [facility] 2/9 01/18/2018 10/12/2017 04/11/2017  Decreased Interest 0 0 0  Down, Depressed, Hopeless 0 0 0  PHQ - 2 Score 0 0 0    Allergies  Allergen Reactions  . Amoxicillin Hives    Pt called in to report on 07/31/11  . Lactose Intolerance (Gi)   . Penicillins Hives  . Sulfamethoxazole-Trimethoprim     REACTION: conjunctivitis    Prior to Admission medications   Medication Sig Start Date End Date Taking? Authorizing Provider  Ascorbic Acid (VITAMIN C) 1000 MG tablet Take 1,000 mg by mouth daily.   Yes [provider]  b complex vitamins tablet Take 1 tablet by mouth daily.   Yes [provider]  DULoxetine (CYMBALTA) 60 MG capsule Take 1 capsule (60 mg total) by mouth daily. 01/18/18  Yes Myles Lipps, MD  fluticasone [facility]) 50 MCG/ACT nasal spray Place 2 sprays into both nostrils daily. 05/24/17  Yes Ethelda Chick, MD  thyroid (NATURE-THROID) 65 MG tablet Take 1 tablet (65 mg total) by mouth daily. 04/11/17  Yes Ethelda Chick, MD  tretinoin (RETIN-A) 0.025 % gel Apply topically at bedtime. 10/12/17  Yes Ethelda Chick, MD  valACYclovir (VALTREX) 1000 MG tablet Take 1 tablet (1,000 mg total) by mouth 2 (two) times daily. 01/26/18  Yes Myles Lipps, MD  prazosin (MINIPRESS) 1 MG capsule Take 1 capsule (1 mg total) by mouth at bedtime. Patient not taking: Reported on 02/25/2018 01/18/18   Myles Lipps, MD    Past Medical History:  Diagnosis Date  . Allergy    Allegra, Flonase  . Anxiety   . GERD (gastroesophageal reflux disease)   . Herpes labialis   . Hypothyroidism   . IBS (irritable bowel syndrome)   . Right wrist fracture   . Thyroid disease     Past Surgical History:  Procedure Laterality Date  . CESAREAN SECTION     x 1  . HERNIA REPAIR Left   . ORIF WRIST FRACTURE Right 02/17/2017   Procedure: OPEN REDUCTION INTERNAL FIXATION (ORIF) WRIST FRACTURE;  Surgeon: Sheral Apley, MD;  Location: [facility];  Service: Orthopedics;  Laterality: Right;  . TUBAL LIGATION      Social History   Tobacco Use  . Smoking status: Never Smoker  .  Smokeless tobacco: Never Used  Substance Use Topics  . Alcohol use: Yes    Alcohol/week: 1.0 standard drinks    Types: 1 Glasses of wine per week    Comment: social    Family History  Problem Relation Age of Onset  . Osteoporosis Mother   . Hypothyroidism Mother   . Alzheimer's disease Mother   . Hypertension Father   . Hypothyroidism Father   . Stroke Father   . Hypothyroidism Sister   . Angioedema Sister   . Hypothyroidism Sister   . Angioedema Sister   . Hypothyroidism Sister   . Angioedema Sister     Review of Systems  Constitutional: Positive for malaise/fatigue.  Genitourinary: Negative for dysuria and hematuria.  Psychiatric/Behavioral: Positive for depression. Negative for hallucinations and  suicidal ideas. The patient is nervous/anxious and has insomnia.    Per hpi  OBJECTIVE:  Blood pressure 110/74, pulse 72, temperature 97.9 F (36.6 C), temperature source Oral, height 5' 7.75" (1.721 m), weight 141 lb (64 kg), SpO2 96 %. Body mass index is 21.6 kg/m.   Physical Exam  Constitutional: She is oriented to person, place, and time. She appears well-developed and well-nourished.  HENT:  Head: Normocephalic and atraumatic.  Mouth/Throat: Mucous membranes are normal.  Eyes: Pupils are equal, round, and reactive to light. Conjunctivae and EOM are normal. No scleral icterus.  Neck: Neck supple.  Pulmonary/Chest: Effort normal.  Neurological: She is alert and oriented to person, place, and time.  Skin: Skin is warm and dry.  Psychiatric: She has a normal mood and affect.  Nursing note and vitals reviewed.   ASSESSMENT and PLAN  1. Anxiety and depression 2. Nightmares Discussed doing trial of more traditional insomnia medication such as trazodone. Discussed usually not used for nightmares but will help with sleep in general. Discussed r/se/b  3. Herpes simplex infection Recurring outbreaks, starting daily suppressive therapy - valACYclovir (VALTREX) 500 MG tablet; Take 2 tablets (1,000 mg total) by mouth 2 (two) times daily.  Other orders - traZODone (DESYREL) 50 MG tablet; Take 0.5-1 tablets (25-50 mg total) by mouth at bedtime as needed for sleep. - DULoxetine (CYMBALTA) 60 MG capsule; Take 1 capsule (60 mg total) by mouth daily.  She will bring in forms to request waiver of high copays  Return for medicare annual wellness visit. initial    Myles LippsIrma M Santiago, MD Primary [facility] at [facility] [address] [place], [place] [address] Ph.  [phone] Fax [phone]

## 2018-02-25 NOTE — Patient Instructions (Signed)
° ° ° °  If you have lab work done today you will be contacted with your lab results within the next 2 weeks.  If you have not heard from us then please contact us. The fastest way to get your results is to register for My Chart. ° ° °IF you received an x-ray today, you will receive an invoice from [facility]. Please contact [facility] at [phone] with questions or concerns regarding your invoice.  ° °IF you received labwork today, you will receive an invoice from [facility]. Please contact [facility] at [phone] with questions or concerns regarding your invoice.  ° °Our billing staff will not be able to assist you with questions regarding bills from these companies. ° °You will be contacted with the lab results as soon as they are available. The fastest way to get your results is to activate your My Chart account. Instructions are located on the last page of this paperwork. If you have not heard from us regarding the results in 2 weeks, please contact this office. °  ° ° ° °

## 2018-03-03 ENCOUNTER — Encounter: Payer: Self-pay | Admitting: Family Medicine

## 2018-03-04 DIAGNOSIS — R69 Illness, unspecified: Secondary | ICD-10-CM | POA: Diagnosis not present

## 2018-03-05 DIAGNOSIS — M9905 Segmental and somatic dysfunction of pelvic region: Secondary | ICD-10-CM | POA: Diagnosis not present

## 2018-03-05 DIAGNOSIS — M9902 Segmental and somatic dysfunction of thoracic region: Secondary | ICD-10-CM | POA: Diagnosis not present

## 2018-03-05 DIAGNOSIS — M9903 Segmental and somatic dysfunction of lumbar region: Secondary | ICD-10-CM | POA: Diagnosis not present

## 2018-03-05 DIAGNOSIS — M9901 Segmental and somatic dysfunction of cervical region: Secondary | ICD-10-CM | POA: Diagnosis not present

## 2018-03-19 DIAGNOSIS — M9903 Segmental and somatic dysfunction of lumbar region: Secondary | ICD-10-CM | POA: Diagnosis not present

## 2018-03-19 DIAGNOSIS — M9905 Segmental and somatic dysfunction of pelvic region: Secondary | ICD-10-CM | POA: Diagnosis not present

## 2018-03-19 DIAGNOSIS — M9902 Segmental and somatic dysfunction of thoracic region: Secondary | ICD-10-CM | POA: Diagnosis not present

## 2018-03-19 DIAGNOSIS — M9901 Segmental and somatic dysfunction of cervical region: Secondary | ICD-10-CM | POA: Diagnosis not present

## 2018-06-07 DIAGNOSIS — M9902 Segmental and somatic dysfunction of thoracic region: Secondary | ICD-10-CM | POA: Diagnosis not present

## 2018-06-07 DIAGNOSIS — M9901 Segmental and somatic dysfunction of cervical region: Secondary | ICD-10-CM | POA: Diagnosis not present

## 2018-06-07 DIAGNOSIS — M9903 Segmental and somatic dysfunction of lumbar region: Secondary | ICD-10-CM | POA: Diagnosis not present

## 2018-06-07 DIAGNOSIS — M9905 Segmental and somatic dysfunction of pelvic region: Secondary | ICD-10-CM | POA: Diagnosis not present

## 2018-06-08 ENCOUNTER — Other Ambulatory Visit: Payer: Self-pay | Admitting: *Deleted

## 2018-06-08 ENCOUNTER — Encounter: Payer: Medicare HMO | Admitting: Family Medicine

## 2018-06-08 ENCOUNTER — Telehealth: Payer: Self-pay | Admitting: Family Medicine

## 2018-06-08 MED ORDER — FLUTICASONE PROPIONATE 50 MCG/ACT NA SUSP: 2.0000 | Freq: Every day | NASAL | 3 refills | Status: DC

## 2018-06-08 NOTE — Telephone Encounter (Signed)
Pt came in for appt today with Dr. Leretha PolSantiago who is out of the office.sick. I was able to reschedule her CPE for 06/25/18 at 9:20 with Dr. Leretha PolSantiago. I double checked with the pt if she was supposed to be having a CPE or a wellness visit. Pt states that it is a CPE. I advised pt of time, building and late policy. She will come in early and do labs and wait for the appt at 9:20.

## 2018-06-14 DIAGNOSIS — R69 Illness, unspecified: Secondary | ICD-10-CM | POA: Diagnosis not present

## 2018-06-21 DIAGNOSIS — H52223 Regular astigmatism, bilateral: Secondary | ICD-10-CM | POA: Diagnosis not present

## 2018-06-21 DIAGNOSIS — H5213 Myopia, bilateral: Secondary | ICD-10-CM | POA: Diagnosis not present

## 2018-06-25 ENCOUNTER — Other Ambulatory Visit: Payer: Self-pay

## 2018-06-25 ENCOUNTER — Ambulatory Visit (INDEPENDENT_AMBULATORY_CARE_PROVIDER_SITE_OTHER): Payer: Medicare HMO | Admitting: Family Medicine

## 2018-06-25 ENCOUNTER — Encounter: Payer: Self-pay | Admitting: Family Medicine

## 2018-06-25 VITALS — BP 119/75 | HR 66 | Temp 98.4°F | Ht 67.75 in | Wt 142.0 lb

## 2018-06-25 DIAGNOSIS — Z1321 Encounter for screening for nutritional disorder: Secondary | ICD-10-CM | POA: Diagnosis not present

## 2018-06-25 DIAGNOSIS — Z23 Encounter for immunization: Secondary | ICD-10-CM

## 2018-06-25 DIAGNOSIS — Z780 Asymptomatic menopausal state: Secondary | ICD-10-CM | POA: Diagnosis not present

## 2018-06-25 DIAGNOSIS — Z130 Encounter for screening for diseases of the blood and blood-forming organs and certain disorders involving the immune mechanism: Secondary | ICD-10-CM

## 2018-06-25 DIAGNOSIS — Z13228 Encounter for screening for other metabolic disorders: Secondary | ICD-10-CM | POA: Diagnosis not present

## 2018-06-25 DIAGNOSIS — Z0000 Encounter for general adult medical examination without abnormal findings: Secondary | ICD-10-CM | POA: Diagnosis not present

## 2018-06-25 DIAGNOSIS — Z1329 Encounter for screening for other suspected endocrine disorder: Secondary | ICD-10-CM

## 2018-06-25 DIAGNOSIS — Z1239 Encounter for other screening for malignant neoplasm of breast: Secondary | ICD-10-CM | POA: Diagnosis not present

## 2018-06-25 DIAGNOSIS — Z13220 Encounter for screening for lipoid disorders: Secondary | ICD-10-CM | POA: Diagnosis not present

## 2018-06-25 DIAGNOSIS — E034 Atrophy of thyroid (acquired): Secondary | ICD-10-CM | POA: Diagnosis not present

## 2018-06-25 MED ORDER — FLUTICASONE PROPIONATE 50 MCG/ACT NA SUSP: 2.0000 | Freq: Every day | NASAL | 3 refills | Status: DC

## 2018-06-25 MED ORDER — ZOSTER VAC RECOMB ADJUVANTED 50 MCG/0.5ML IM SUSR: 0.5000 mL | Freq: Once | INTRAMUSCULAR | 1 refills | Status: AC

## 2018-06-25 MED ORDER — TRAZODONE HCL 50 MG PO TABS: 25.0000 mg | ORAL_TABLET | Freq: Every evening | ORAL | 3 refills | Status: DC | PRN

## 2018-06-25 MED ORDER — PROPRANOLOL HCL 20 MG PO TABS: 20.0000 mg | ORAL_TABLET | ORAL | 1 refills | Status: DC | PRN

## 2018-06-25 NOTE — Progress Notes (Signed)
Presents today for [facility] Wellness Visit-Initial.   Date of last exam: a year ago  Interpreter used for this visit? n/a  Patient Care Team: Rutherford Guys, MD as PCP - General (Family Medicine)   Other items to address today: none   Cancer Screening: Cervical: 2017, neg pap and HPV, denies h/o abnormal Breast: due today Colon: yes, 2016, Dr Cristina Gong  Other Screening: Last screening for diabetes: today Last lipid screening: today Dexa: done Goes to eye doctor yearly Goes to dentist regularly  excercises regulary Healthy diet  Functional Status Survey: Is the patient deaf or have difficulty hearing?: No Does the patient have difficulty seeing, even when wearing glasses/contacts?: No Does the patient have difficulty concentrating, remembering, or making decisions?: No Does the patient have difficulty walking or climbing stairs?: No Does the patient have difficulty dressing or bathing?: No Does the patient have difficulty doing errands alone such as visiting a doctor's office or shopping?: No    ADVANCE DIRECTIVES: Discussed: yes Patient desires CPR (needs to think about it), mechanical ventilation (needs to think about it), prolonged artificial support (may include mechanical ventilation, tube/PEG feeding, etc) (needs to think about it). On File: n/a Materials Provided: yes   Immunization status:  Immunization History  Administered Date(s) Administered  . Influenza, High Dose Seasonal PF 06/14/2018  . Influenza,inj,Quad PF,6+ Mos 02/25/2014, 06/11/2015, 04/11/2017  . Influenza-Unspecified 07/03/2016  . Td 06/09/1996  . Tdap 02/25/2014     Health Maintenance Due  Topic Date Due  . MAMMOGRAM  05/13/2008  . DEXA SCAN  01/14/2018      Patient Active Problem List   Diagnosis Date Noted  . Hypothyroidism due to acquired atrophy of thyroid 12/27/2017  . Irritable bowel syndrome with both constipation and diarrhea 10/27/2016  . Herpes labialis  12/19/2013  . Fear of public speaking 09/81/1914  . Allergic rhinitis 09/07/2013  . Anxiety and depression 09/07/2013  . GERD 02/17/2007     Past Medical History:  Diagnosis Date  . Allergy    Allegra, Flonase  . Anxiety   . GERD (gastroesophageal reflux disease)   . Herpes labialis   . Hypothyroidism   . IBS (irritable bowel syndrome)   . Right wrist fracture   . Thyroid disease      Past Surgical History:  Procedure Laterality Date  . CESAREAN SECTION     x 1  . HERNIA REPAIR Left   . ORIF WRIST FRACTURE Right 02/17/2017   Procedure: OPEN REDUCTION INTERNAL FIXATION (ORIF) WRIST FRACTURE;  Surgeon: Renette Butters, MD;  Location: [facility];  Service: Orthopedics;  Laterality: Right;  . TUBAL LIGATION       Family History  Problem Relation Age of Onset  . Osteoporosis Mother   . Hypothyroidism Mother   . Alzheimer's disease Mother   . Hypertension Father   . Hypothyroidism Father   . Stroke Father   . Hypothyroidism Sister   . Angioedema Sister   . Hypothyroidism Sister   . Angioedema Sister   . Hypothyroidism Sister   . Angioedema Sister      Social History   Socioeconomic History  . Marital status: Married    Spouse name: Not on file  . Number of children: 3  . Years of education: Not on file  . Highest education level: Not on file  Occupational History  . Not on file  Social Needs  . Financial resource strain: Not on file  . Food insecurity:  Worry: Not on file    Inability: Not on file  . Transportation needs:    Medical: Not on file    Non-medical: Not on file  Tobacco Use  . Smoking status: Never Smoker  . Smokeless tobacco: Never Used  Substance and Sexual Activity  . Alcohol use: Yes    Alcohol/week: 1.0 standard drinks    Types: 1 Glasses of wine per week    Comment: social  . Drug use: No  . Sexual activity: Yes  Lifestyle  . Physical activity:    Days per week: Not on file    Minutes per session: Not on  file  . Stress: Not on file  Relationships  . Social connections:    Talks on phone: Not on file    Gets together: Not on file    Attends religious service: Not on file    Active member of club or organization: Not on file    Attends meetings of clubs or organizations: Not on file    Relationship status: Not on file  . Intimate partner violence:    Fear of current or ex partner: Not on file    Emotionally abused: Not on file    Physically abused: Not on file    Forced sexual activity: Not on file  Other Topics Concern  . 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 Amoxicillin Hives    Pt called in to report on 07/31/11  . Lactose Intolerance (Gi)   . Penicillins Hives  . Sulfamethoxazole-Trimethoprim     REACTION: conjunctivitis     Prior to Admission medications   Medication Sig Start Date End Date Taking? Authorizing Provider  Ascorbic Acid (VITAMIN C) 1000 MG tablet Take 1,000 mg by mouth daily.   Yes [provider]  b complex vitamins tablet Take 1 tablet by mouth daily.   Yes [provider]  DULoxetine (CYMBALTA) 60 MG capsule Take 1 capsule (60 mg total) by mouth daily. 02/25/18  Yes Santiago, Irma M, MD  fluticasone (FLONASE) 50 MCG/ACT nasal spray Place 2 sprays into both nostrils daily. 06/08/18  Yes Santiago, Irma M, MD  propranolol (INDERAL) 20 MG tablet Take 20 mg by mouth as needed.   Yes [provider]  thyroid (NATURE-THROID) 65 MG tablet Take 1 tablet (65 mg total) by mouth daily. 04/11/17  Yes Smith, Kristi M, MD  traZODone (DESYREL) 50 MG  tablet Take 0.5-1 tablets (25-50 mg total) by mouth at bedtime as needed for sleep. 02/25/18  Yes Santiago, Irma M, MD  tretinoin (RETIN-A) 0.025 % gel Apply topically at bedtime. 10/12/17  Yes Smith, Kristi M, MD  valACYclovir (VALTREX) 500 MG tablet Take 2 tablets (1,000 mg total) by mouth 2 (two) times daily. 02/25/18  Yes Santiago, Irma M, MD     Depression screen PHQ 2/9 06/25/2018 01/18/2018 10/12/2017 04/11/2017 10/02/2016  Decreased Interest 0 0 0 0 0  Down, Depressed, Hopeless 0 0 0 0 0    PHQ - 2 Score 0 0 0 0 0     Fall Risk  06/25/2018 01/18/2018 10/12/2017 04/11/2017 10/02/2016  Falls in the past year? 0 Yes No Yes No  Number falls in past yr: - 1 - 1 -  Injury with Fall? - Yes - Yes -  Comment - broke wrist  - - -     PHYSICAL EXAM: BP 119/75 (BP Location: Left Arm, Patient Position: Sitting, Cuff Size: Normal)   Pulse 66   Temp 98.4 F (36.9 C) (Oral)   Ht 5' 7.75" (1.721 m)   Wt 142 lb (64.4 kg)   SpO2 96%   BMI 21.75 kg/m   Wt Readings from Last 3 Encounters:  06/25/18 142 lb (64.4 kg)  02/25/18 141 lb (64 kg)  01/18/18 139 lb 3.2 oz (63.1 kg)     Physical Exam   Education/Counseling provided regarding diet and exercise, prevention of chronic diseases, smoking/tobacco cessation if appropriate, and reviewed "Covered Medicare Preventive Services"  ASSESSMENT/PLAN: 1. Medicare annual wellness visit, initial Routine HCM labs ordered. HCM reviewed/discussed. Anticipatory guidance regarding healthy weight, lifestyle and choices given.   2. Hypothyroidism due to acquired atrophy of thyroid - TSH  3. Screening for lipid disorders - Lipid panel  4. Screening for endocrine, nutritional, metabolic and immunity disorder - CMP14+EGFR  5. Screening for breast cancer - MM Digital Screening; Future  6. Postmenopausal estrogen deficiency - DG Bone Density; Future  7. Need for pneumococcal vaccination - Pneumococcal conjugate vaccine 13-valent IM  Other orders -  fluticasone (FLONASE) 50 MCG/ACT nasal spray; Place 2 sprays into both nostrils daily. - propranolol (INDERAL) 20 MG tablet; Take 1 tablet (20 mg total) by mouth as needed. - traZODone (DESYREL) 50 MG tablet; Take 0.5-1 tablets (25-50 mg total) by mouth at bedtime as needed for sleep. - Zoster Vaccine Adjuvanted [facility]) injection; Inject 0.5 mLs into the muscle once for 1 dose. Repeat second dose in 2 months

## 2018-06-25 NOTE — Patient Instructions (Addendum)
Bone Health Bones protect organs, store calcium, anchor muscles, and support the whole body. Keeping your bones strong is important, especially as you get older. You can take actions to help keep your bones strong and healthy. Why is keeping my bones healthy important?  Keeping your bones healthy is important because your body constantly replaces bone cells. Cells get old, and new cells take their place. As we age, we lose bone cells because the body may not be able to make enough new cells to replace the old cells. The amount of bone cells and bone tissue you have is referred to as bone mass. The higher your bone mass, the stronger your bones. The aging process leads to an overall loss of bone mass in the body, which can increase the likelihood of:  Joint pain and stiffness.  Broken bones.  A condition in which the bones become weak and brittle (osteoporosis). A large decline in bone mass occurs in older adults. In women, it occurs about the time of menopause. What actions can I take to keep my bones healthy? Good health habits are important for maintaining healthy bones. This includes eating nutritious foods and exercising regularly. To have healthy bones, you need to get enough of the right minerals and vitamins. Most nutrition experts recommend getting these nutrients from the foods that you eat. In some cases, taking supplements may also be recommended. Doing certain types of exercise is also important for bone health. What are the nutritional recommendations for healthy bones?  Eating a well-balanced diet with plenty of calcium and vitamin D will help to protect your bones. Nutritional recommendations vary from person to person. Ask your health care provider what is healthy for you. Here are some general guidelines. Get enough calcium Calcium is the most important (essential) mineral for bone health. Most people can get enough calcium from their diet, but supplements may be  recommended for people who are at risk for osteoporosis. Good sources of calcium include:  Dairy products, such as low-fat or nonfat milk, cheese, and yogurt.  Dark green leafy vegetables, such as bok choy and broccoli.  Calcium-fortified foods, such as orange juice, cereal, bread, soy beverages, and tofu products.  Nuts, such as almonds. Follow these recommended amounts for daily calcium intake:  Children, age 50-3: 700 mg.  Children, age 43-8: 1,000 mg.  Children, age 430-13: 1,300 mg.  Teens, age 507-18: 1,300 mg.  Adults, age 435-50: 1,000 mg.  Adults, age 509-70: ? Men: 1,000 mg. ? Women: 1,200 mg.  Adults, age 33 or older: 1,200 mg.  Pregnant and breastfeeding females: ? Teens: 1,300 mg. ? Adults: 1,000 mg. Get enough vitamin D Vitamin D is the most essential vitamin for bone health. It helps the body absorb calcium. Sunlight stimulates the skin to make vitamin D, so be sure to get enough sunlight. If you live in a cold climate or you do not get outside often, your health care provider may recommend that you take vitamin D supplements. Good sources of vitamin D in your diet include:  Egg yolks.  Saltwater fish.  Milk and cereal fortified with vitamin D. Follow these recommended amounts for daily vitamin D intake:  Children and teens, age 50-18: 600 international units.  Adults, age 8 or younger: 400-800 international units.  Adults, age 22 or older: 800-1,000 international units. Get other important nutrients Other nutrients that are important for bone health include:  Phosphorus. This mineral is found in meat, poultry, dairy foods,  nuts, and legumes. The recommended daily intake for adult men and adult women is 700 mg.  Magnesium. This mineral is found in seeds, nuts, dark green vegetables, and legumes. The recommended daily intake for adult men is 400-420 mg. For adult women, it is 310-320 mg.  Vitamin K. This vitamin is found in green leafy vegetables. The  recommended daily intake is 120 mg for adult men and 90 mg for adult women. What type of physical activity is best for building and maintaining healthy bones? Weight-bearing and strength-building activities are important for building and maintaining healthy bones. Weight-bearing activities cause muscles and bones to work against gravity. Strength-building activities increase the strength of the muscles that support bones. Weight-bearing and muscle-building activities include:  Walking and hiking.  Jogging and running.  Dancing.  Gym exercises.  Lifting weights.  Tennis and racquetball.  Climbing stairs.  Aerobics. Adults should get at least 30 minutes of moderate physical activity on most days. Children should get at least 60 minutes of moderate physical activity on most days. Ask your health care provider what type of exercise is best for you. How can I find out if my bone mass is low? Bone mass can be measured with an X-ray test called a bone mineral density (BMD) test. This test is recommended for all women who are age 96 or older. It may also be recommended for:  Men who are age 54 or older.  People who are at risk for osteoporosis because of: ? Having bones that break easily. ? Having a long-term disease that weakens bones, such as kidney disease or rheumatoid arthritis. ? Having menopause earlier than normal. ? Taking medicine that weakens bones, such as steroids, thyroid hormones, or hormone treatment for breast cancer or prostate cancer. ? Smoking. ? Drinking three or more alcoholic drinks a day. If you find that you have a low bone mass, you may be able to prevent osteoporosis or further bone loss by changing your diet and lifestyle. Where can I find more information? For more information, check out the following websites:  [place]: AviationTales.fr  [employer] of Health: www.bones.SouthExposed.es  International Osteoporosis Foundation:  Administrator.iofbonehealth.org Summary  The aging process leads to an overall loss of bone mass in the body, which can increase the likelihood of broken bones and osteoporosis.  Eating a well-balanced diet with plenty of calcium and vitamin D will help to protect your bones.  Weight-bearing and strength-building activities are also important for building and maintaining strong bones.  Bone mass can be measured with an X-ray test called a bone mineral density (BMD) test. This information is not intended to replace advice given to you by your health care provider. Make sure you discuss any questions you have with your health care provider. Document Released: 08/16/2003 Document Revised: 06/22/2017 Document Reviewed: 06/22/2017 Elsevier Interactive Patient Education  2019 [place] 65 Years and Older, Female Preventive care refers to lifestyle choices and visits with your health care provider that can promote health and wellness. What does preventive care include?  A yearly physical exam. This is also called an annual well check.  Dental exams once or twice a year.  Routine eye exams. Ask your health care provider how often you should have your eyes checked.  Personal lifestyle choices, including: ? Daily care of your teeth and gums. ? Regular physical activity. ? Eating a healthy diet. ? Avoiding tobacco and drug use. ? Limiting alcohol use. ? Practicing safe sex. ?  Taking low-dose aspirin every day. ? Taking vitamin and mineral supplements as recommended by your health care provider. What happens during an annual well check? The services and screenings done by your health care provider during your annual well check will depend on your age, overall health, lifestyle risk factors, and family history of disease. Counseling Your health care provider may ask you questions about your:  Alcohol use.  Tobacco use.  Drug use.  Emotional well-being.  Home and  relationship well-being.  Sexual activity.  Eating habits.  History of falls.  Memory and ability to understand (cognition).  Work and work Statistician.  Reproductive health.  Screening You may have the following tests or measurements:  Height, weight, and BMI.  Blood pressure.  Lipid and cholesterol levels. These may be checked every 5 years, or more frequently if you are over 74 years old.  Skin check.  Lung cancer screening. You may have this screening every year starting at age 81 if you have a 30-pack-year history of smoking and currently smoke or have quit within the past 15 years.  Colorectal cancer screening. All adults should have this screening starting at age 52 and continuing until age 21. You will have tests every 1-10 years, depending on your results and the type of screening test. People at increased risk should start screening at an earlier age. Screening tests may include: ? Guaiac-based fecal occult blood testing. ? Fecal immunochemical test (FIT). ? Stool DNA test. ? Virtual colonoscopy. ? Sigmoidoscopy. During this test, a flexible tube with a tiny camera (sigmoidoscope) is used to examine your rectum and lower colon. The sigmoidoscope is inserted through your anus into your rectum and lower colon. ? Colonoscopy. During this test, a long, thin, flexible tube with a tiny camera (colonoscope) is used to examine your entire colon and rectum.  Hepatitis C blood test.  Hepatitis B blood test.  Sexually transmitted disease (STD) testing.  Diabetes screening. This is done by checking your blood sugar (glucose) after you have not eaten for a while (fasting). You may have this done every 1-3 years.  Bone density scan. This is done to screen for osteoporosis. You may have this done starting at age 61.  Mammogram. This may be done every 1-2 years. Talk to your health care provider about how often you should have regular mammograms. Talk with your health care  provider about your test results, treatment options, and if necessary, the need for more tests. Vaccines Your health care provider may recommend certain vaccines, such as:  Influenza vaccine. This is recommended every year.  Tetanus, diphtheria, and acellular pertussis (Tdap, Td) vaccine. You may need a Td booster every 10 years.  Varicella vaccine. You may need this if you have not been vaccinated.  Zoster vaccine. You may need this after age 31.  Measles, mumps, and rubella (MMR) vaccine. You may need at least one dose of MMR if you were born in 1957 or later. You may also need a second dose.  Pneumococcal 13-valent conjugate (PCV13) vaccine. One dose is recommended after age 57.  Pneumococcal polysaccharide (PPSV23) vaccine. One dose is recommended after age 7.  Meningococcal vaccine. You may need this if you have certain conditions.  Hepatitis A vaccine. You may need this if you have certain conditions or if you travel or work in places where you may be exposed to hepatitis A.  Hepatitis B vaccine. You may need this if you have certain conditions or if you travel or work in  places where you may be exposed to hepatitis B.  Haemophilus influenzae type b (Hib) vaccine. You may need this if you have certain conditions. Talk to your health care provider about which screenings and vaccines you need and how often you need them. This information is not intended to replace advice given to you by your health care provider. Make sure you discuss any questions you have with your health care provider. Document Released: 06/22/2015 Document Revised: 07/16/2017 Document Reviewed: 03/27/2015 Elsevier Interactive Patient Education  [employer].   If you have lab work done today you will be contacted with your lab results within the next 2 weeks.  If you have not heard from [place] then please contact us. The fastest way to get your results is to register for My Chart.   IF you received an  x-ray today, you will receive an invoice from [facility]. Please contact [facility] at [phone] with questions or concerns regarding your invoice.   IF you received labwork today, you will receive an invoice from [place]. Please contact [facility] at [phone] with questions or concerns regarding your invoice.   Our billing staff will not be able to assist you with questions regarding bills from these companies.  You will be contacted with the lab results as soon as they are available. The fastest way to get your results is to activate your My Chart account. Instructions are located on the last page of this paperwork. If you have not heard from [place] regarding the results in 2 weeks, please contact this office.

## 2018-06-26 LAB — CMP14+EGFR
ALT: 16 IU/L (ref 0–32)
AST: 21 IU/L (ref 0–40)
Albumin/Globulin Ratio: 2.6 — ABNORMAL HIGH (ref 1.2–2.2)
Albumin: 4.2 g/dL (ref 3.6–4.8)
Alkaline Phosphatase: 86 IU/L (ref 39–117)
BUN/Creatinine Ratio: 13 (ref 12–28)
BUN: 10 mg/dL (ref 8–27)
Bilirubin Total: 0.5 mg/dL (ref 0.0–1.2)
CO2: 24 mmol/L (ref 20–29)
Calcium: 9.4 mg/dL (ref 8.7–10.3)
Chloride: 105 mmol/L (ref 96–106)
Creatinine, Ser: 0.75 mg/dL (ref 0.57–1.00)
GFR calc Af Amer: 97 mL/min/{1.73_m2} (ref >59)
GFR calc non Af Amer: 84 mL/min/{1.73_m2} (ref >59)
Globulin, Total: 1.6 g/dL (ref 1.5–4.5)
Glucose: 91 mg/dL (ref 65–99)
Potassium: 4.1 mmol/L (ref 3.5–5.2)
Sodium: 144 mmol/L (ref 134–144)
Total Protein: 5.8 g/dL — ABNORMAL LOW (ref 6.0–8.5)

## 2018-06-26 LAB — LIPID PANEL
Chol/HDL Ratio: 2.6 ratio (ref 0.0–4.4)
Cholesterol, Total: 218 mg/dL — ABNORMAL HIGH (ref 100–199)
HDL: 84 mg/dL (ref >39)
LDL Calculated: 122 mg/dL — ABNORMAL HIGH (ref 0–99)
Triglycerides: 59 mg/dL (ref 0–149)
VLDL Cholesterol Cal: 12 mg/dL (ref 5–40)

## 2018-06-26 LAB — TSH: TSH: 4.04 u[IU]/mL (ref 0.450–4.500)

## 2018-07-06 DIAGNOSIS — M9901 Segmental and somatic dysfunction of cervical region: Secondary | ICD-10-CM | POA: Diagnosis not present

## 2018-07-06 DIAGNOSIS — M9905 Segmental and somatic dysfunction of pelvic region: Secondary | ICD-10-CM | POA: Diagnosis not present

## 2018-07-06 DIAGNOSIS — M9902 Segmental and somatic dysfunction of thoracic region: Secondary | ICD-10-CM | POA: Diagnosis not present

## 2018-07-06 DIAGNOSIS — M9903 Segmental and somatic dysfunction of lumbar region: Secondary | ICD-10-CM | POA: Diagnosis not present

## 2018-07-13 DIAGNOSIS — Z0100 Encounter for examination of eyes and vision without abnormal findings: Secondary | ICD-10-CM | POA: Diagnosis not present

## 2018-08-05 DIAGNOSIS — R69 Illness, unspecified: Secondary | ICD-10-CM | POA: Diagnosis not present

## 2018-08-19 DIAGNOSIS — M9905 Segmental and somatic dysfunction of pelvic region: Secondary | ICD-10-CM | POA: Diagnosis not present

## 2018-08-19 DIAGNOSIS — M9901 Segmental and somatic dysfunction of cervical region: Secondary | ICD-10-CM | POA: Diagnosis not present

## 2018-08-19 DIAGNOSIS — M9902 Segmental and somatic dysfunction of thoracic region: Secondary | ICD-10-CM | POA: Diagnosis not present

## 2018-08-19 DIAGNOSIS — M9903 Segmental and somatic dysfunction of lumbar region: Secondary | ICD-10-CM | POA: Diagnosis not present

## 2018-09-07 ENCOUNTER — Ambulatory Visit: Payer: Medicare HMO | Admitting: Allergy and Immunology

## 2018-11-23 DIAGNOSIS — L259 Unspecified contact dermatitis, unspecified cause: Secondary | ICD-10-CM | POA: Diagnosis not present

## 2018-12-21 ENCOUNTER — Ambulatory Visit: Payer: Medicare HMO | Admitting: Family Medicine

## 2018-12-25 ENCOUNTER — Other Ambulatory Visit: Payer: Self-pay | Admitting: Family Medicine

## 2018-12-25 DIAGNOSIS — B009 Herpesviral infection, unspecified: Secondary | ICD-10-CM

## 2018-12-25 NOTE — Telephone Encounter (Signed)
Requested Prescriptions  Pending Prescriptions Disp Refills  . valACYclovir (VALTREX) 500 MG tablet [Pharmacy Med Name: valACYclovir HCl 500 MG Oral Tablet] 90 tablet 0    Sig: TAKE 2 CAPLETS BY MOUTH TWICE DAILY     Antimicrobials:  Antiviral Agents - Anti-Herpetic Passed - 12/25/2018 12:06 PM      Passed - Valid encounter within last 12 months    Recent Outpatient Visits          6 months ago Medicare annual wellness visit, initial   [facility], Lilia Argue, MD   10 months ago Anxiety and depression   [facility], Lilia Argue, MD   11 months ago Anxiety and depression   [facility], Lilia Argue, MD   1 year ago Anxiety and depression   [facility] at [facility], Renette Butters, MD   1 year ago Anxiety and depression   [facility] at [facility], Renette Butters, MD

## 2019-01-03 DIAGNOSIS — M9903 Segmental and somatic dysfunction of lumbar region: Secondary | ICD-10-CM | POA: Diagnosis not present

## 2019-01-03 DIAGNOSIS — M9905 Segmental and somatic dysfunction of pelvic region: Secondary | ICD-10-CM | POA: Diagnosis not present

## 2019-01-03 DIAGNOSIS — M9901 Segmental and somatic dysfunction of cervical region: Secondary | ICD-10-CM | POA: Diagnosis not present

## 2019-01-03 DIAGNOSIS — M9902 Segmental and somatic dysfunction of thoracic region: Secondary | ICD-10-CM | POA: Diagnosis not present

## 2019-01-05 DIAGNOSIS — M9903 Segmental and somatic dysfunction of lumbar region: Secondary | ICD-10-CM | POA: Diagnosis not present

## 2019-01-05 DIAGNOSIS — M9901 Segmental and somatic dysfunction of cervical region: Secondary | ICD-10-CM | POA: Diagnosis not present

## 2019-01-05 DIAGNOSIS — M9902 Segmental and somatic dysfunction of thoracic region: Secondary | ICD-10-CM | POA: Diagnosis not present

## 2019-01-05 DIAGNOSIS — M9905 Segmental and somatic dysfunction of pelvic region: Secondary | ICD-10-CM | POA: Diagnosis not present

## 2019-02-03 DIAGNOSIS — M9903 Segmental and somatic dysfunction of lumbar region: Secondary | ICD-10-CM | POA: Diagnosis not present

## 2019-02-03 DIAGNOSIS — M9902 Segmental and somatic dysfunction of thoracic region: Secondary | ICD-10-CM | POA: Diagnosis not present

## 2019-02-03 DIAGNOSIS — M9905 Segmental and somatic dysfunction of pelvic region: Secondary | ICD-10-CM | POA: Diagnosis not present

## 2019-02-03 DIAGNOSIS — M9901 Segmental and somatic dysfunction of cervical region: Secondary | ICD-10-CM | POA: Diagnosis not present

## 2019-02-05 ENCOUNTER — Other Ambulatory Visit: Payer: Self-pay | Admitting: Family Medicine

## 2019-02-05 NOTE — Telephone Encounter (Signed)
Requested medication (s) are due for refill today:yes  Requested medication (s) are on the active medication list: yes  Last refill:  02/25/2018  Future visit scheduled: no  Notes to clinic:  Needs OV    Requested Prescriptions  Pending Prescriptions Disp Refills   DULoxetine (CYMBALTA) 60 MG capsule [Pharmacy Med Name: DULoxetine HCl 60 MG Oral Capsule Delayed Release Particles] 90 capsule 0    Sig: Take 1 capsule by mouth once daily     Psychiatry: Antidepressants - SNRI Failed - 02/05/2019 11:11 AM      Failed - Valid encounter within last 6 months    Recent Outpatient Visits          7 months ago Medicare annual wellness visit, initial   [facility], Lilia Argue, MD   11 months ago Anxiety and depression   [facility], Lilia Argue, MD   1 year ago Anxiety and depression   [facility], Lilia Argue, MD   1 year ago Anxiety and depression   Primary [facility] at [facility], Renette Butters, MD   1 year ago Anxiety and depression   [facility] at [facility], Renette Butters, MD             Passed - Last BP in normal range    BP Readings from Last 1 Encounters:  06/25/18 119/75         Passed - Completed PHQ-2 or PHQ-9 in the last 360 days.

## 2019-02-08 NOTE — Telephone Encounter (Signed)
No further refills without office visit. Courtesy 30 day supply sent

## 2019-02-10 ENCOUNTER — Encounter: Payer: Self-pay | Admitting: Family Medicine

## 2019-02-11 ENCOUNTER — Other Ambulatory Visit: Payer: Self-pay

## 2019-02-11 DIAGNOSIS — R6889 Other general symptoms and signs: Secondary | ICD-10-CM | POA: Diagnosis not present

## 2019-02-11 DIAGNOSIS — Z20822 Contact with and (suspected) exposure to covid-19: Secondary | ICD-10-CM

## 2019-02-12 LAB — NOVEL CORONAVIRUS, NAA: SARS-CoV-2, NAA: NOT DETECTED

## 2019-02-21 DIAGNOSIS — M9902 Segmental and somatic dysfunction of thoracic region: Secondary | ICD-10-CM | POA: Diagnosis not present

## 2019-02-21 DIAGNOSIS — M9905 Segmental and somatic dysfunction of pelvic region: Secondary | ICD-10-CM | POA: Diagnosis not present

## 2019-02-21 DIAGNOSIS — M9901 Segmental and somatic dysfunction of cervical region: Secondary | ICD-10-CM | POA: Diagnosis not present

## 2019-02-21 DIAGNOSIS — M9903 Segmental and somatic dysfunction of lumbar region: Secondary | ICD-10-CM | POA: Diagnosis not present

## 2019-02-22 ENCOUNTER — Ambulatory Visit (INDEPENDENT_AMBULATORY_CARE_PROVIDER_SITE_OTHER): Payer: Medicare Other | Admitting: Family Medicine

## 2019-02-22 ENCOUNTER — Encounter: Payer: Self-pay | Admitting: Family Medicine

## 2019-02-22 ENCOUNTER — Other Ambulatory Visit: Payer: Self-pay

## 2019-02-22 VITALS — BP 106/70 | HR 69 | Temp 98.4°F | Ht 67.75 in | Wt 141.4 lb

## 2019-02-22 DIAGNOSIS — E034 Atrophy of thyroid (acquired): Secondary | ICD-10-CM | POA: Diagnosis not present

## 2019-02-22 DIAGNOSIS — B009 Herpesviral infection, unspecified: Secondary | ICD-10-CM

## 2019-02-22 DIAGNOSIS — F419 Anxiety disorder, unspecified: Secondary | ICD-10-CM

## 2019-02-22 DIAGNOSIS — F329 Major depressive disorder, single episode, unspecified: Secondary | ICD-10-CM

## 2019-02-22 DIAGNOSIS — F32A Depression, unspecified: Secondary | ICD-10-CM

## 2019-02-22 MED ORDER — DULOXETINE HCL 60 MG PO CPEP: 60.0000 mg | ORAL_CAPSULE | Freq: Every day | ORAL | 3 refills | Status: DC

## 2019-02-22 MED ORDER — PROPRANOLOL HCL 20 MG PO TABS: 20.0000 mg | ORAL_TABLET | ORAL | 1 refills | Status: DC | PRN

## 2019-02-22 MED ORDER — VALACYCLOVIR HCL 500 MG PO TABS: ORAL_TABLET | ORAL | 0 refills | Status: DC

## 2019-02-22 MED ORDER — VALACYCLOVIR HCL 500 MG PO TABS: 1000.0000 mg | ORAL_TABLET | Freq: Every day | ORAL | 3 refills | Status: DC

## 2019-02-22 MED ORDER — SHINGRIX 50 MCG/0.5ML IM SUSR: 0.5000 mL | Freq: Once | INTRAMUSCULAR | 1 refills | Status: AC

## 2019-02-22 MED ORDER — FLUTICASONE PROPIONATE 50 MCG/ACT NA SUSP: 2.0000 | Freq: Every day | NASAL | 3 refills | Status: DC

## 2019-02-22 MED ORDER — TRAZODONE HCL 50 MG PO TABS: 25.0000 mg | ORAL_TABLET | Freq: Every evening | ORAL | 3 refills | Status: DC | PRN

## 2019-02-22 NOTE — Progress Notes (Signed)
9/15/20205:02 PM  Myer HaffDianne Adkins 12/21/1950, 68 y.o., female [id number]  Chief Complaint  Patient presents with  . Medication Refill    cymbalta, screenings completed  . other    decline flu shot, says it's too early    HPI:   Patient is a 68 y.o. female with past medical history significant for depression, anxiety, hypothyroidism, IBS, OA of knees who presents today for routine followup  Last OV Jan 2020 Overall she is doing well Her anxiety and depression are well controlled on duloxetine Takes propronalol only for performance anxiety PHQ9 = 1 GAD 7=  1 trazadone works well for sleep  Has been off nature thryoid since feb 2020 She does not feel any difference  Uses valtrex prn herpes outbreak about once a month   [facility] 06/25/2018 01/18/2018 10/12/2017  Decreased Interest 0 0 0  Down, Depressed, Hopeless 0 0 0  PHQ - 2 Score 0 0 0    Fall Risk  02/22/2019 06/25/2018 01/18/2018 10/12/2017 04/11/2017  Falls in the past year? 0 0 Yes No Yes  Number falls in past yr: 0 - 1 - 1  Injury with Fall? 0 - Yes - Yes  Comment - - broke wrist  - -     Allergies  Allergen Reactions  . Amoxicillin Hives    Pt called in to report on 07/31/11  . Lactose Intolerance (Gi)   . Penicillins Hives  . Sulfamethoxazole-Trimethoprim     REACTION: conjunctivitis    Prior to Admission medications   Medication Sig Start Date End Date Taking? Authorizing Provider  Ascorbic Acid (VITAMIN C) 1000 MG tablet Take 1,000 mg by mouth daily.   Yes [provider]  b complex vitamins tablet Take 1 tablet by mouth daily.   Yes [provider]  DULoxetine (CYMBALTA) 60 MG capsule Take 1 capsule by mouth once daily 02/08/19  Yes Myles LippsSantiago, Bernadette Gores M, MD  fluticasone [facility](FLONASE) 50 MCG/ACT nasal spray Place 2 sprays into both nostrils daily. 06/25/18  Yes Myles LippsSantiago, Brendia Dampier M, MD  propranolol (INDERAL) 20 MG tablet Take 1 tablet (20 mg total) by mouth as needed. 06/25/18  Yes  Myles LippsSantiago, Macie Baum M, MD  thyroid (NATURE-THROID) 65 MG tablet Take 1 tablet (65 mg total) by mouth daily. 04/11/17  Yes Ethelda ChickSmith, Kristi M, MD  traZODone (DESYREL) 50 MG tablet Take 0.5-1 tablets (25-50 mg total) by mouth at bedtime as needed for sleep. 06/25/18  Yes Myles LippsSantiago, Kassadie Pancake M, MD  valACYclovir (VALTREX) 500 MG tablet TAKE 2 CAPLETS BY MOUTH TWICE DAILY 12/25/18  Yes Myles LippsSantiago, Oletta Buehring M, MD    Past Medical History:  Diagnosis Date  . Allergy    Allegra, Flonase  . Anxiety   . GERD (gastroesophageal reflux disease)   . Herpes labialis   . Hypothyroidism   . IBS (irritable bowel syndrome)   . Right wrist fracture   . Thyroid disease     Past Surgical History:  Procedure Laterality Date  . CESAREAN SECTION     x 1  . HERNIA REPAIR Left   . ORIF WRIST FRACTURE Right 02/17/2017   Procedure: OPEN REDUCTION INTERNAL FIXATION (ORIF) WRIST FRACTURE;  Surgeon: Sheral ApleyMurphy, Timothy D, MD;  Location: [facility];  Service: Orthopedics;  Laterality: Right;  . TUBAL LIGATION      Social History   Tobacco Use  . Smoking status: Never Smoker  . Smokeless tobacco: Never Used  Substance Use Topics  . Alcohol use: Yes  Alcohol/week: 1.0 standard drinks    Types: 1 Glasses of wine per week    Comment: social    Family History  Problem Relation Age of Onset  . Osteoporosis Mother   . Hypothyroidism Mother   . Alzheimer's disease Mother   . Hypertension Father   . Hypothyroidism Father   . Stroke Father   . Hypothyroidism Sister   . Angioedema Sister   . Hypothyroidism Sister   . Angioedema Sister   . Hypothyroidism Sister   . Angioedema Sister     Review of Systems  Constitutional: Negative for chills and fever.  Respiratory: Negative for cough and shortness of breath.   Cardiovascular: Negative for chest pain, palpitations and leg swelling.  Gastrointestinal: Negative for abdominal pain, nausea and vomiting.     OBJECTIVE:  Today's Vitals   02/22/19 1649  BP:  106/70  Pulse: 69  Temp: 98.4 F (36.9 C)  SpO2: 95%  Weight: 141 lb 6.4 oz (64.1 kg)  Height: 5' 7.75" (1.721 m)   Body mass index is 21.66 kg/m.   Physical Exam Vitals signs and nursing note reviewed.  Constitutional:      Appearance: She is well-developed.  HENT:     Head: Normocephalic and atraumatic.     Mouth/Throat:     Pharynx: No oropharyngeal exudate.  Eyes:     General: No scleral icterus.    Conjunctiva/sclera: Conjunctivae normal.     Pupils: Pupils are equal, round, and reactive to light.  Neck:     Musculoskeletal: Neck supple.     Thyroid: No thyroid mass, thyromegaly or thyroid tenderness.  Cardiovascular:     Rate and Rhythm: Normal rate and regular rhythm.     Heart sounds: Normal heart sounds. No murmur. No friction rub. No gallop.   Pulmonary:     Effort: Pulmonary effort is normal.     Breath sounds: Normal breath sounds. No wheezing or rales.  Skin:    General: Skin is warm and dry.  Neurological:     Mental Status: She is alert and oriented to person, place, and time.     No results found for this or any previous visit (from the past 24 hour(s)).  No results found.   ASSESSMENT and PLAN  1. Anxiety and depression Controlled. Continue current regime.   2. Hypothyroidism due to acquired atrophy of thyroid Checking labs, off meds. If need to restart, discussed starting levothyroxine instead of nature thyroid - TSH - T4, Free  3. Herpes simplex infection Not controlled. Discussed daily use for suppressive therapy - valACYclovir (VALTREX) 500 MG tablet; Take 2 tablets (1,000 mg total) by mouth daily.  Other orders - DULoxetine (CYMBALTA) 60 MG capsule; Take 1 capsule (60 mg total) by mouth daily. - propranolol (INDERAL) 20 MG tablet; Take 1 tablet (20 mg total) by mouth as needed. - fluticasone (FLONASE) 50 MCG/ACT nasal spray; Place 2 sprays into both nostrils daily. - traZODone (DESYREL) 50 MG tablet; Take 0.5-1 tablets (25-50 mg  total) by mouth at bedtime as needed for sleep. - Zoster Vaccine Adjuvanted [facility]) injection; Inject 0.5 mLs into the muscle once for 1 dose.  Return in about 6 months (around 08/22/2019).    Rutherford Guys, MD Primary Care at [facility] [place], [place] [address] Ph.  [phone] Fax [phone]

## 2019-02-22 NOTE — Patient Instructions (Signed)
° ° ° °  If you have lab work done today you will be contacted with your lab results within the next 2 weeks.  If you have not heard from us then please contact us. The fastest way to get your results is to register for My Chart. ° ° °IF you received an x-ray today, you will receive an invoice from [facility]. Please contact [facility] at [phone] with questions or concerns regarding your invoice.  ° °IF you received labwork today, you will receive an invoice from [facility]. Please contact [facility] at [phone] with questions or concerns regarding your invoice.  ° °Our billing staff will not be able to assist you with questions regarding bills from these companies. ° °You will be contacted with the lab results as soon as they are available. The fastest way to get your results is to activate your My Chart account. Instructions are located on the last page of this paperwork. If you have not heard from us regarding the results in 2 weeks, please contact this office. °  ° ° ° °

## 2019-02-23 LAB — TSH: TSH: 5.55 u[IU]/mL — ABNORMAL HIGH (ref 0.450–4.500)

## 2019-02-23 LAB — T4, FREE: Free T4: 1.03 ng/dL (ref 0.82–1.77)

## 2019-02-25 DIAGNOSIS — M9905 Segmental and somatic dysfunction of pelvic region: Secondary | ICD-10-CM | POA: Diagnosis not present

## 2019-02-25 DIAGNOSIS — M9902 Segmental and somatic dysfunction of thoracic region: Secondary | ICD-10-CM | POA: Diagnosis not present

## 2019-02-25 DIAGNOSIS — M9901 Segmental and somatic dysfunction of cervical region: Secondary | ICD-10-CM | POA: Diagnosis not present

## 2019-02-25 DIAGNOSIS — M9903 Segmental and somatic dysfunction of lumbar region: Secondary | ICD-10-CM | POA: Diagnosis not present

## 2019-03-01 ENCOUNTER — Other Ambulatory Visit: Payer: Self-pay | Admitting: Family Medicine

## 2019-03-01 ENCOUNTER — Encounter: Payer: Self-pay | Admitting: Family Medicine

## 2019-03-08 DIAGNOSIS — M9901 Segmental and somatic dysfunction of cervical region: Secondary | ICD-10-CM | POA: Diagnosis not present

## 2019-03-08 DIAGNOSIS — M9902 Segmental and somatic dysfunction of thoracic region: Secondary | ICD-10-CM | POA: Diagnosis not present

## 2019-03-08 DIAGNOSIS — M9903 Segmental and somatic dysfunction of lumbar region: Secondary | ICD-10-CM | POA: Diagnosis not present

## 2019-03-08 DIAGNOSIS — M9905 Segmental and somatic dysfunction of pelvic region: Secondary | ICD-10-CM | POA: Diagnosis not present

## 2019-03-22 DIAGNOSIS — M9902 Segmental and somatic dysfunction of thoracic region: Secondary | ICD-10-CM | POA: Diagnosis not present

## 2019-03-22 DIAGNOSIS — M9905 Segmental and somatic dysfunction of pelvic region: Secondary | ICD-10-CM | POA: Diagnosis not present

## 2019-03-22 DIAGNOSIS — M9903 Segmental and somatic dysfunction of lumbar region: Secondary | ICD-10-CM | POA: Diagnosis not present

## 2019-03-22 DIAGNOSIS — M9901 Segmental and somatic dysfunction of cervical region: Secondary | ICD-10-CM | POA: Diagnosis not present

## 2019-05-09 DIAGNOSIS — M9905 Segmental and somatic dysfunction of pelvic region: Secondary | ICD-10-CM | POA: Diagnosis not present

## 2019-05-09 DIAGNOSIS — M9901 Segmental and somatic dysfunction of cervical region: Secondary | ICD-10-CM | POA: Diagnosis not present

## 2019-05-09 DIAGNOSIS — M9903 Segmental and somatic dysfunction of lumbar region: Secondary | ICD-10-CM | POA: Diagnosis not present

## 2019-05-09 DIAGNOSIS — M9902 Segmental and somatic dysfunction of thoracic region: Secondary | ICD-10-CM | POA: Diagnosis not present

## 2019-05-26 DIAGNOSIS — M9905 Segmental and somatic dysfunction of pelvic region: Secondary | ICD-10-CM | POA: Diagnosis not present

## 2019-05-26 DIAGNOSIS — M9902 Segmental and somatic dysfunction of thoracic region: Secondary | ICD-10-CM | POA: Diagnosis not present

## 2019-05-26 DIAGNOSIS — M9903 Segmental and somatic dysfunction of lumbar region: Secondary | ICD-10-CM | POA: Diagnosis not present

## 2019-05-26 DIAGNOSIS — M9901 Segmental and somatic dysfunction of cervical region: Secondary | ICD-10-CM | POA: Diagnosis not present

## 2019-06-22 DIAGNOSIS — M9905 Segmental and somatic dysfunction of pelvic region: Secondary | ICD-10-CM | POA: Diagnosis not present

## 2019-06-22 DIAGNOSIS — M9902 Segmental and somatic dysfunction of thoracic region: Secondary | ICD-10-CM | POA: Diagnosis not present

## 2019-06-22 DIAGNOSIS — M9901 Segmental and somatic dysfunction of cervical region: Secondary | ICD-10-CM | POA: Diagnosis not present

## 2019-06-22 DIAGNOSIS — M9903 Segmental and somatic dysfunction of lumbar region: Secondary | ICD-10-CM | POA: Diagnosis not present

## 2019-07-01 DIAGNOSIS — Z03818 Encounter for observation for suspected exposure to other biological agents ruled out: Secondary | ICD-10-CM | POA: Diagnosis not present

## 2019-07-01 DIAGNOSIS — Z20828 Contact with and (suspected) exposure to other viral communicable diseases: Secondary | ICD-10-CM | POA: Diagnosis not present

## 2019-07-06 DIAGNOSIS — Z20828 Contact with and (suspected) exposure to other viral communicable diseases: Secondary | ICD-10-CM | POA: Diagnosis not present

## 2019-07-18 ENCOUNTER — Ambulatory Visit: Payer: Medicare HMO | Attending: Internal Medicine

## 2019-07-18 DIAGNOSIS — Z23 Encounter for immunization: Secondary | ICD-10-CM | POA: Insufficient documentation

## 2019-07-18 NOTE — Progress Notes (Signed)
   Covid-19 Vaccination Clinic  Name:  Shannon Adkins    MRN: [id number] DOB: 02-05-1951  07/18/2019  Shannon Adkins was observed post Covid-19 immunization for 15 minutes without incidence. She was provided with Vaccine Information Sheet and instruction to access the V-Safe system.   Shannon Adkins was instructed to call 911 with any severe reactions post vaccine: Marland Kitchen Difficulty breathing  . Swelling of your face and throat  . A fast heartbeat  . A bad rash all over your body  . Dizziness and weakness    Immunizations Administered    Name Date Dose VIS Date Route   Pfizer COVID-19 Vaccine 07/18/2019  3:31 PM 0.3 mL 05/20/2019 Intramuscular   Manufacturer: [employer]   Lot: [id number]   NDC: [address]-0

## 2019-07-22 ENCOUNTER — Telehealth: Payer: Self-pay | Admitting: Family Medicine

## 2019-07-22 NOTE — Telephone Encounter (Signed)
Called pt and informed her that she should have some refills on file. Pt stated understanding.

## 2019-07-22 NOTE — Telephone Encounter (Signed)
What is the name of the medication?valACYclovir (VALTREX) 500 MG tablet    Have you contacted your pharmacy to request a refill? No  Which pharmacy would you like this sent to?  [facility]  [address]    Patient notified that their request is being sent to the clinical staff for review and that they should receive a call once it is complete. If they do not receive a call within 72 hours they can check with their pharmacy or our office.   You have an appt coming up 08/25/2019 at 3:00

## 2019-07-25 DIAGNOSIS — M9902 Segmental and somatic dysfunction of thoracic region: Secondary | ICD-10-CM | POA: Diagnosis not present

## 2019-07-25 DIAGNOSIS — M9903 Segmental and somatic dysfunction of lumbar region: Secondary | ICD-10-CM | POA: Diagnosis not present

## 2019-07-25 DIAGNOSIS — M9905 Segmental and somatic dysfunction of pelvic region: Secondary | ICD-10-CM | POA: Diagnosis not present

## 2019-07-25 DIAGNOSIS — M9901 Segmental and somatic dysfunction of cervical region: Secondary | ICD-10-CM | POA: Diagnosis not present

## 2019-08-12 ENCOUNTER — Ambulatory Visit: Payer: Medicare HMO | Attending: Internal Medicine

## 2019-08-12 DIAGNOSIS — Z23 Encounter for immunization: Secondary | ICD-10-CM

## 2019-08-12 NOTE — Progress Notes (Signed)
   Covid-19 Vaccination [facility]  Name:  Shannon Adkins    MRN: [id number] DOB: [birth year]  08/12/2019  [name] was observed post Covid-19 immunization for 15 minutes without incident. She was provided with Vaccine Information Sheet and instruction to access the V-Safe system.   [name] was instructed to call 911 with any severe reactions post vaccine: Marland Kitchen Difficulty breathing  . Swelling of face and throat  . A fast heartbeat  . A bad rash all over body  . Dizziness and weakness   Immunizations Administered    Name Date Dose VIS Date Route   Pfizer COVID-19 Vaccine 08/12/2019 12:05 AM 0.3 mL 05/20/2019 Intramuscular   Manufacturer: [employer], [employer]   Lot: [id number]   NDC: [address]-6

## 2019-08-25 ENCOUNTER — Ambulatory Visit: Payer: Medicare Other | Admitting: Family Medicine

## 2019-09-14 ENCOUNTER — Other Ambulatory Visit: Payer: Self-pay

## 2019-09-14 ENCOUNTER — Encounter: Payer: Self-pay | Admitting: Family Medicine

## 2019-09-14 DIAGNOSIS — B009 Herpesviral infection, unspecified: Secondary | ICD-10-CM

## 2019-09-14 MED ORDER — VALACYCLOVIR HCL 500 MG PO TABS: 1000.0000 mg | ORAL_TABLET | Freq: Every day | ORAL | 0 refills | Status: DC

## 2019-09-14 MED ORDER — TRAZODONE HCL 50 MG PO TABS: 25.0000 mg | ORAL_TABLET | Freq: Every evening | ORAL | 1 refills | Status: DC | PRN

## 2019-09-14 NOTE — Telephone Encounter (Signed)
Called patient and got her physical scheduled with Dr. Leretha Pol and also sent a medication refill request.

## 2019-09-14 NOTE — Addendum Note (Signed)
Addended by: Myles Lipps on: 09/14/2019 12:04 PM   Modules accepted: Orders

## 2019-09-14 NOTE — Telephone Encounter (Signed)
Patient is requesting a refill of the following medications: Requested Prescriptions   Pending Prescriptions Disp Refills  . valACYclovir (VALTREX) 500 MG tablet 90 tablet 3    Sig: Take 2 tablets (1,000 mg total) by mouth daily.  . traZODone (DESYREL) 50 MG tablet 90 tablet 3    Sig: Take 0.5-1 tablets (25-50 mg total) by mouth at bedtime as needed for sleep.    Date of patient request: 09/14/2019 Last office visit: 02/22/2019 Date of last refill: 02/22/2019 Last refill amount: 90 days 3 refills  Follow up time period per chart: 10/21/2019

## 2019-09-16 DIAGNOSIS — M9902 Segmental and somatic dysfunction of thoracic region: Secondary | ICD-10-CM | POA: Diagnosis not present

## 2019-09-16 DIAGNOSIS — M9903 Segmental and somatic dysfunction of lumbar region: Secondary | ICD-10-CM | POA: Diagnosis not present

## 2019-09-16 DIAGNOSIS — M9901 Segmental and somatic dysfunction of cervical region: Secondary | ICD-10-CM | POA: Diagnosis not present

## 2019-09-16 DIAGNOSIS — M9905 Segmental and somatic dysfunction of pelvic region: Secondary | ICD-10-CM | POA: Diagnosis not present

## 2019-09-29 ENCOUNTER — Telehealth: Payer: Self-pay | Admitting: Family Medicine

## 2019-09-29 NOTE — Telephone Encounter (Signed)
Spoke with patient about scheduling labs 3-4 days before physical but,patient prefers to wait until physical on 5?14/2021 would like to add some more labs and wants to talk to provider about it on visit

## 2019-10-05 DIAGNOSIS — H5213 Myopia, bilateral: Secondary | ICD-10-CM | POA: Diagnosis not present

## 2019-10-05 DIAGNOSIS — H52223 Regular astigmatism, bilateral: Secondary | ICD-10-CM | POA: Diagnosis not present

## 2019-10-21 ENCOUNTER — Other Ambulatory Visit: Payer: Self-pay

## 2019-10-21 ENCOUNTER — Encounter: Payer: Self-pay | Admitting: Family Medicine

## 2019-10-21 ENCOUNTER — Ambulatory Visit (INDEPENDENT_AMBULATORY_CARE_PROVIDER_SITE_OTHER): Payer: Medicare HMO | Admitting: Family Medicine

## 2019-10-21 VITALS — BP 118/77 | HR 69 | Temp 97.9°F | Ht 67.75 in | Wt 127.0 lb

## 2019-10-21 DIAGNOSIS — E7800 Pure hypercholesterolemia, unspecified: Secondary | ICD-10-CM | POA: Diagnosis not present

## 2019-10-21 DIAGNOSIS — Z1231 Encounter for screening mammogram for malignant neoplasm of breast: Secondary | ICD-10-CM | POA: Diagnosis not present

## 2019-10-21 DIAGNOSIS — B009 Herpesviral infection, unspecified: Secondary | ICD-10-CM

## 2019-10-21 DIAGNOSIS — F329 Major depressive disorder, single episode, unspecified: Secondary | ICD-10-CM

## 2019-10-21 DIAGNOSIS — Z23 Encounter for immunization: Secondary | ICD-10-CM

## 2019-10-21 DIAGNOSIS — F419 Anxiety disorder, unspecified: Secondary | ICD-10-CM

## 2019-10-21 DIAGNOSIS — Z0000 Encounter for general adult medical examination without abnormal findings: Secondary | ICD-10-CM

## 2019-10-21 DIAGNOSIS — Z780 Asymptomatic menopausal state: Secondary | ICD-10-CM | POA: Diagnosis not present

## 2019-10-21 DIAGNOSIS — E039 Hypothyroidism, unspecified: Secondary | ICD-10-CM

## 2019-10-21 DIAGNOSIS — R69 Illness, unspecified: Secondary | ICD-10-CM | POA: Diagnosis not present

## 2019-10-21 DIAGNOSIS — E038 Other specified hypothyroidism: Secondary | ICD-10-CM

## 2019-10-21 DIAGNOSIS — Z0001 Encounter for general adult medical examination with abnormal findings: Secondary | ICD-10-CM | POA: Diagnosis not present

## 2019-10-21 DIAGNOSIS — F32A Depression, unspecified: Secondary | ICD-10-CM

## 2019-10-21 DIAGNOSIS — M810 Age-related osteoporosis without current pathological fracture: Secondary | ICD-10-CM

## 2019-10-21 MED ORDER — VALACYCLOVIR HCL 500 MG PO TABS: 1000.0000 mg | ORAL_TABLET | Freq: Every day | ORAL | 0 refills | Status: DC

## 2019-10-21 NOTE — Progress Notes (Signed)
Presents today for [employer] Visit-Subsequent.   Date of last exam: Jun 25 2018  Interpreter used for this visit? n/a  Patient Care Team: Rutherford Guys, MD as PCP - General (Family Medicine)   Other items to address today:  She will be seeing gyn as having nightsweats/nightmares and she has been using topical OTC estorgen/progesterone like creams and sx have improved Depression very well controlled on cymbalta Stopped taking levothyroxine about a year ago as she felt it was not helping Has finally been able to control her IBS with diet Walks daily, does yoga Takes multivitamin and vitamin D supplement  Lab Results  Component Value Date   TSH 5.550 (H) 02/22/2019  normal FT4   Cancer Screening: Cervical: 2017, neg pap and HPV, denies h/o abnormal Breast: declines Colon: colonoscopy 2016, Dr Cristina Gong  Other Screening: Last screening for diabetes: jan 2020 Last lipid screening: HLP, last LDL 122  ADVANCE DIRECTIVES: Discussed: No  Immunization status:  Immunization History  Administered Date(s) Administered  . Influenza, High Dose Seasonal PF 06/14/2018  . Influenza,inj,Quad PF,6+ Mos 02/25/2014, 06/11/2015, 04/11/2017  . Influenza-Unspecified 07/03/2016  . PFIZER SARS-COV-2 Vaccination 07/18/2019, 08/12/2019  . Pneumococcal Conjugate-13 06/25/2018  . Td 06/09/1996  . Tdap 02/25/2014     Health Maintenance Due  Topic Date Due  . MAMMOGRAM  05/13/2008  . DEXA SCAN  Never done  . PNA vac Low Risk Adult (2 of 2 - PPSV23) 06/26/2019   6CIT Screen 10/21/2019 06/25/2018  What Year? 0 points 0 points  What month? 0 points 0 points  What time? 0 points -  Count back from 20 0 points 0 points  Months in reverse 0 points 0 points  Repeat phrase 0 points 0 points  Total Score 0 -     Functional Status Survey: Is the patient deaf or have difficulty hearing?: No Does the patient have difficulty seeing, even when wearing glasses/contacts?:  No Does the patient have difficulty concentrating, remembering, or making decisions?: No Does the patient have difficulty walking or climbing stairs?: No Does the patient have difficulty dressing or bathing?: No Does the patient have difficulty doing errands alone such as visiting a doctor's office or shopping?: No   Home Environment: safe at home  Urinary Incontinence Screening: denies  Patient Active Problem List   Diagnosis Date Noted  . Hypothyroidism due to acquired atrophy of thyroid 12/27/2017  . Irritable bowel syndrome with both constipation and diarrhea 10/27/2016  . Herpes labialis 12/19/2013  . Fear of public speaking 07/62/2633  . Allergic rhinitis 09/07/2013  . Anxiety and depression 09/07/2013  . GERD 02/17/2007     Past Medical History:  Diagnosis Date  . Allergy    Allegra, Flonase  . Anxiety   . GERD (gastroesophageal reflux disease)   . Herpes labialis   . Hypothyroidism   . IBS (irritable bowel syndrome)   . Right wrist fracture   . Thyroid disease      Past Surgical History:  Procedure Laterality Date  . CESAREAN SECTION     x 1  . HERNIA REPAIR Left   . ORIF WRIST FRACTURE Right 02/17/2017   Procedure: OPEN REDUCTION INTERNAL FIXATION (ORIF) WRIST FRACTURE;  Surgeon: Renette Butters, MD;  Location: [facility];  Service: Orthopedics;  Laterality: Right;  . TUBAL LIGATION       Family History  Problem Relation Age of Onset  . Osteoporosis Mother   . Hypothyroidism Mother   .  Alzheimer's disease Mother   . Hypertension Father   . Hypothyroidism Father   . Stroke Father   . Hypothyroidism Sister   . Angioedema Sister   . Hypothyroidism Sister   . Angioedema Sister   . Hypothyroidism Sister   . Angioedema Sister      Social History   Socioeconomic History  . Marital status: Married    Spouse name: Not on file  . Number of children: 3  . Years of education: Not on file  . Highest education level: Not on file   Occupational History  . Not on file  Tobacco Use  . Smoking status: Never Smoker  . Smokeless tobacco: Never Used  Substance and Sexual Activity  . Alcohol use: Yes    Alcohol/week: 1.0 standard drinks    Types: 1 Glasses of wine per week    Comment: social  . Drug use: No  . Sexual activity: Yes  Other Topics Concern  . 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 Difficulty of Paying Living Expenses:   Food Insecurity:   . Worried About Charity fundraiser in the Last Year:   . Arboriculturist in the Last Year:   Transportation Needs:   . Film/video editor (Medical):   Marland Kitchen Lack of Transportation (Non-Medical):   Physical Activity:   . Days of Exercise per Week:   . Minutes of Exercise per Session:   Stress:   . Feeling of Stress :   Social Connections:   . Frequency of Communication with Friends and Family:   . Frequency of Social Gatherings with Friends and Family:   . Attends Religious Services:   . Active Member of Clubs or Organizations:   . Attends Archivist Meetings:   Marland Kitchen Marital Status:   Intimate Partner Violence:   . Fear of Current or Ex-Partner:   . Emotionally Abused:   Marland Kitchen Physically Abused:   . Sexually Abused:      Allergies  Allergen Reactions  . Amoxicillin Hives    Pt called in to report on 07/31/11  . Lactose Intolerance (Gi)   . Penicillins Hives  . Sulfamethoxazole-Trimethoprim     REACTION: conjunctivitis     Prior to Admission medications   Medication Sig Start Date End Date Taking? Authorizing Provider   Ascorbic Acid (VITAMIN C) 1000 MG tablet Take 1,000 mg by mouth daily.   Yes [provider]  b complex vitamins tablet Take 1 tablet by mouth daily.   Yes [provider]  DULoxetine (CYMBALTA) 60 MG capsule Take 1 capsule (60 mg total) by mouth daily. 02/22/19  Yes Rutherford Guys, MD  fluticasone [facility]) 50 MCG/ACT nasal spray Place 2 sprays into both nostrils daily. 02/22/19  Yes Rutherford Guys, MD  propranolol (INDERAL) 20 MG tablet Take 1 tablet (20 mg total) by mouth as needed. 02/22/19  Yes Rutherford Guys, MD  traZODone [facility])  50 MG tablet Take 0.5-1 tablets (25-50 mg total) by mouth at bedtime as needed for sleep. 09/14/19  Yes Rutherford Guys, MD  valACYclovir (VALTREX) 500 MG tablet Take 2 tablets (1,000 mg total) by mouth daily. 09/14/19  Yes Rutherford Guys, MD     Depression screen [facility] 2/9 10/21/2019 06/25/2018 01/18/2018 10/12/2017 04/11/2017  Decreased Interest 0 0 0 0 0  Down, Depressed, Hopeless 0 0 0 0 0  PHQ - 2 Score 0 0 0 0 0     Fall Risk  10/21/2019 02/22/2019 06/25/2018 01/18/2018 10/12/2017  Falls in the past year? 0 0 0 Yes No  Number falls in past yr: 0 0 - 1 -  Injury with Fall? 0 0 - Yes -  Comment - - - broke wrist  -  Follow up Falls evaluation completed - - - -      PHYSICAL EXAM: BP 118/77   Pulse 69   Temp 97.9 F (36.6 C)   Ht 5' 7.75" (1.721 m)   Wt 127 lb (57.6 kg)   SpO2 99%   BMI 19.45 kg/m    Wt Readings from Last 3 Encounters:  10/21/19 127 lb (57.6 kg)  02/22/19 141 lb 6.4 oz (64.1 kg)  06/25/18 142 lb (64.4 kg)      Hearing Screening   '125Hz'  '250Hz'  '500Hz'  '1000Hz'  '2000Hz'  '3000Hz'  '4000Hz'  '6000Hz'  '8000Hz'   Right ear:           Left ear:             Visual Acuity Screening   Right eye Left eye Both eyes  Without correction:     With correction: 2070 2025 2025   Has seen eye doctor within past year and dentist within past 6 months  Physical Exam  Constitutional: She is oriented to person, place, and time and  well-developed, well-nourished, and in no distress.  HENT:  Head: Normocephalic and atraumatic.  Right Ear: Hearing, tympanic membrane, external ear and ear canal normal.  Left Ear: Hearing, tympanic membrane, external ear and ear canal normal.  Mouth/Throat: Oropharynx is clear and moist.  Eyes: Pupils are equal, round, and reactive to light. EOM are normal.  Neck: No thyromegaly present.  Cardiovascular: Normal rate, regular rhythm, normal heart sounds and intact distal pulses. Exam reveals no gallop and no friction rub.  No murmur heard. Pulmonary/Chest: Effort normal and breath sounds normal. She has no wheezes. She has no rhonchi. She has no rales. Right breast exhibits no mass, no nipple discharge and no skin change. Left breast exhibits no mass, no nipple discharge and no skin change.  Chaperone present  Abdominal: Soft. Bowel sounds are normal. She exhibits no distension and no mass. There is no abdominal tenderness.  Musculoskeletal:        General: No edema. Normal range of motion.     Cervical back: Neck supple.  Lymphadenopathy:    She has no cervical adenopathy.    She has no axillary adenopathy.       Right: No supraclavicular adenopathy present.       Left: No supraclavicular adenopathy present.  Neurological: She is alert and oriented to person, place, and time. She has normal reflexes. Gait normal.  Skin: Skin is warm and dry.  Psychiatric: Mood and affect normal.  Nursing note and vitals reviewed.    Education/Counseling provided regarding diet and exercise, prevention of chronic diseases, smoking/tobacco cessation, if applicable, and reviewed "Covered Medicare Preventive Services."   ASSESSMENT/PLAN:  1. Encounter  for Medicare annual wellness exam Routine HCM labs ordered. HCM reviewed/discussed. Anticipatory guidance regarding healthy weight, lifestyle and choices given.   2. Visit for screening mammogram Declines with informed consent  3. Subclinical  hypothyroidism - CMP14+EGFR - TSH - T4, Free  4. Pure hypercholesterolemia - Lipid panel - CMP14+EGFR  5. Herpes simplex infection - valACYclovir (VALTREX) 500 MG tablet; Take 2 tablets (1,000 mg total) by mouth daily.  6. Anxiety and depression Controlled. Continue current regime.   7. Postmenopausal estrogen deficiency - DG Bone Density; Future - VITAMIN D 25 Hydroxy (Vit-D Deficiency, Fractures)  Other orders - Pneumococcal polysaccharide vaccine 23-valent greater than or equal to 2yo subcutaneous/IM

## 2019-10-21 NOTE — Patient Instructions (Addendum)
   If you have lab work done today you will be contacted with your lab results within the next 2 weeks.  If you have not heard from us then please contact us. The fastest way to get your results is to register for My Chart.   IF you received an x-ray today, you will receive an invoice from [facility]. Please contact [facility] at [phone] with questions or concerns regarding your invoice.   IF you received labwork today, you will receive an invoice from [facility]. Please contact [facility] at [phone] with questions or concerns regarding your invoice.   Our billing staff will not be able to assist you with questions regarding bills from these companies.  You will be contacted with the lab results as soon as they are available. The fastest way to get your results is to activate your My Chart account. Instructions are located on the last page of this paperwork. If you have not heard from us regarding the results in 2 weeks, please contact this office.      Preventive Care 67 Years and Older, Female Preventive care refers to lifestyle choices and visits with your health care provider that can promote health and wellness. This includes:  A yearly physical exam. This is also called an annual well check.  Regular dental and eye exams.  Immunizations.  Screening for certain conditions.  Healthy lifestyle choices, such as diet and exercise. What can I expect for my preventive care visit? Physical exam Your health care provider will check:  Height and weight. These may be used to calculate body mass index (BMI), which is a measurement that tells if you are at a healthy weight.  Heart rate and blood pressure.  Your skin for abnormal spots. Counseling Your health care provider may ask you questions about:  Alcohol, tobacco, and drug use.  Emotional well-being.  Home and relationship well-being.  Sexual activity.  Eating habits.  History of  falls.  Memory and ability to understand (cognition).  Work and work environment.  Pregnancy and menstrual history. What immunizations do I need?  Influenza (flu) vaccine  This is recommended every year. Tetanus, diphtheria, and pertussis (Tdap) vaccine  You may need a Td booster every 10 years. Varicella (chickenpox) vaccine  You may need this vaccine if you have not already been vaccinated. Zoster (shingles) vaccine  You may need this after age 60. Pneumococcal conjugate (PCV13) vaccine  One dose is recommended after age 67. Pneumococcal polysaccharide (PPSV23) vaccine  One dose is recommended after age 67. Measles, mumps, and rubella (MMR) vaccine  You may need at least one dose of MMR if you were born in 1957 or later. You may also need a second dose. Meningococcal conjugate (MenACWY) vaccine  You may need this if you have certain conditions. Hepatitis A vaccine  You may need this if you have certain conditions or if you travel or work in places where you may be exposed to hepatitis A. Hepatitis B vaccine  You may need this if you have certain conditions or if you travel or work in places where you may be exposed to hepatitis B. Haemophilus influenzae type b (Hib) vaccine  You may need this if you have certain conditions. You may receive vaccines as individual doses or as more than one vaccine together in one shot (combination vaccines). Talk with your health care provider about the risks and benefits of combination vaccines. What tests do I need? Blood tests  Lipid and cholesterol levels.   These may be checked every 5 years, or more frequently depending on your overall health.  Hepatitis C test.  Hepatitis B test. Screening  Lung cancer screening. You may have this screening every year starting at age 17 if you have a 30-pack-year history of smoking and currently smoke or have quit within the past 15 years.  Colorectal cancer screening. All adults should  have this screening starting at age 57 and continuing until age 92. Your health care provider may recommend screening at age 41 if you are at increased risk. You will have tests every 1-10 years, depending on your results and the type of screening test.  Diabetes screening. This is done by checking your blood sugar (glucose) after you have not eaten for a while (fasting). You may have this done every 1-3 years.  Mammogram. This may be done every 1-2 years. Talk with your health care provider about how often you should have regular mammograms.  BRCA-related cancer screening. This may be done if you have a family history of breast, ovarian, tubal, or peritoneal cancers. Other tests  Sexually transmitted disease (STD) testing.  Bone density scan. This is done to screen for osteoporosis. You may have this done starting at age 22. Follow these instructions at home: Eating and drinking  Eat a diet that includes fresh fruits and vegetables, whole grains, lean protein, and low-fat dairy products. Limit your intake of foods with high amounts of sugar, saturated fats, and salt.  Take vitamin and mineral supplements as recommended by your health care provider.  Do not drink alcohol if your health care provider tells you not to drink.  If you drink alcohol: ? Limit how much you have to 0-1 drink a day. ? Be aware of how much alcohol is in your drink. In the [place] one drink equals one 12 oz bottle of beer (355 mL), one 5 oz glass of wine (148 mL), or one 1 oz glass of hard liquor (44 mL). Lifestyle  Take daily care of your teeth and gums.  Stay active. Exercise for at least 30 minutes on 5 or more days each week.  Do not use any products that contain nicotine or tobacco, such as cigarettes, e-cigarettes, and chewing tobacco. If you need help quitting, ask your health care provider.  If you are sexually active, practice safe sex. Use a condom or other form of protection in order to prevent STIs  (sexually transmitted infections).  Talk with your health care provider about taking a low-dose aspirin or statin. What's next?  Go to your health care provider once a year for a well check visit.  Ask your health care provider how often you should have your eyes and teeth checked.  Stay up to date on all vaccines. This information is not intended to replace advice given to you by your health care provider. Make sure you discuss any questions you have with your health care provider. Document Revised: 05/20/2018 Document Reviewed: 05/20/2018 Elsevier Patient Education  2020 [employer].

## 2019-10-22 LAB — CMP14+EGFR
ALT: 15 IU/L (ref 0–32)
AST: 22 IU/L (ref 0–40)
Albumin/Globulin Ratio: 2.3 — ABNORMAL HIGH (ref 1.2–2.2)
Albumin: 4.3 g/dL (ref 3.8–4.8)
Alkaline Phosphatase: 86 IU/L (ref 39–117)
BUN/Creatinine Ratio: 12 (ref 12–28)
BUN: 9 mg/dL (ref 8–27)
Bilirubin Total: 0.6 mg/dL (ref 0.0–1.2)
CO2: 26 mmol/L (ref 20–29)
Calcium: 9.4 mg/dL (ref 8.7–10.3)
Chloride: 104 mmol/L (ref 96–106)
Creatinine, Ser: 0.77 mg/dL (ref 0.57–1.00)
GFR calc Af Amer: 93 mL/min/{1.73_m2} (ref >59)
GFR calc non Af Amer: 81 mL/min/{1.73_m2} (ref >59)
Globulin, Total: 1.9 g/dL (ref 1.5–4.5)
Glucose: 89 mg/dL (ref 65–99)
Potassium: 4 mmol/L (ref 3.5–5.2)
Sodium: 144 mmol/L (ref 134–144)
Total Protein: 6.2 g/dL (ref 6.0–8.5)

## 2019-10-22 LAB — LIPID PANEL
Chol/HDL Ratio: 2.7 ratio (ref 0.0–4.4)
Cholesterol, Total: 218 mg/dL — ABNORMAL HIGH (ref 100–199)
HDL: 82 mg/dL (ref >39)
LDL Chol Calc (NIH): 123 mg/dL — ABNORMAL HIGH (ref 0–99)
Triglycerides: 77 mg/dL (ref 0–149)
VLDL Cholesterol Cal: 13 mg/dL (ref 5–40)

## 2019-10-22 LAB — T4, FREE: Free T4: 0.88 ng/dL (ref 0.82–1.77)

## 2019-10-22 LAB — VITAMIN D 25 HYDROXY (VIT D DEFICIENCY, FRACTURES): Vit D, 25-Hydroxy: 56.7 ng/mL (ref 30.0–100.0)

## 2019-10-22 LAB — TSH: TSH: 4.24 u[IU]/mL (ref 0.450–4.500)

## 2019-11-08 DIAGNOSIS — M9902 Segmental and somatic dysfunction of thoracic region: Secondary | ICD-10-CM | POA: Diagnosis not present

## 2019-11-08 DIAGNOSIS — M9905 Segmental and somatic dysfunction of pelvic region: Secondary | ICD-10-CM | POA: Diagnosis not present

## 2019-11-08 DIAGNOSIS — M9903 Segmental and somatic dysfunction of lumbar region: Secondary | ICD-10-CM | POA: Diagnosis not present

## 2019-11-08 DIAGNOSIS — M9901 Segmental and somatic dysfunction of cervical region: Secondary | ICD-10-CM | POA: Diagnosis not present

## 2019-11-14 ENCOUNTER — Other Ambulatory Visit: Payer: Self-pay

## 2019-11-14 ENCOUNTER — Ambulatory Visit
Admission: RE | Admit: 2019-11-14 | Discharge: 2019-11-14 | Disposition: A | Discharge status: Home / Self Care | Payer: Medicare HMO | Source: Ambulatory Visit | Attending: Family Medicine | Admitting: Family Medicine

## 2019-11-14 DIAGNOSIS — Z780 Asymptomatic menopausal state: Secondary | ICD-10-CM | POA: Diagnosis not present

## 2019-11-14 DIAGNOSIS — M8588 Other specified disorders of bone density and structure, other site: Secondary | ICD-10-CM | POA: Diagnosis not present

## 2019-11-14 DIAGNOSIS — M810 Age-related osteoporosis without current pathological fracture: Secondary | ICD-10-CM | POA: Diagnosis not present

## 2019-11-17 DIAGNOSIS — N951 Menopausal and female climacteric states: Secondary | ICD-10-CM | POA: Diagnosis not present

## 2019-11-17 DIAGNOSIS — M810 Age-related osteoporosis without current pathological fracture: Secondary | ICD-10-CM

## 2019-11-17 HISTORY — DX: Age-related osteoporosis without current pathological fracture: M81.0

## 2019-11-17 MED ORDER — ALENDRONATE SODIUM 70 MG PO TABS: 70.0000 mg | ORAL_TABLET | ORAL | 3 refills | Status: DC

## 2019-11-17 NOTE — Addendum Note (Signed)
Addended by: Myles Lipps on: 11/17/2019 11:21 AM   Modules accepted: Orders

## 2019-11-29 DIAGNOSIS — R232 Flushing: Secondary | ICD-10-CM | POA: Diagnosis not present

## 2019-11-29 DIAGNOSIS — M810 Age-related osteoporosis without current pathological fracture: Secondary | ICD-10-CM | POA: Diagnosis not present

## 2019-11-29 DIAGNOSIS — R69 Illness, unspecified: Secondary | ICD-10-CM | POA: Diagnosis not present

## 2019-11-29 DIAGNOSIS — N951 Menopausal and female climacteric states: Secondary | ICD-10-CM | POA: Diagnosis not present

## 2019-11-29 DIAGNOSIS — G479 Sleep disorder, unspecified: Secondary | ICD-10-CM | POA: Diagnosis not present

## 2019-11-30 DIAGNOSIS — I8312 Varicose veins of left lower extremity with inflammation: Secondary | ICD-10-CM | POA: Diagnosis not present

## 2019-11-30 DIAGNOSIS — I8311 Varicose veins of right lower extremity with inflammation: Secondary | ICD-10-CM | POA: Diagnosis not present

## 2019-12-12 ENCOUNTER — Encounter: Payer: Self-pay | Admitting: Family Medicine

## 2019-12-13 MED ORDER — ZOSTER VAC RECOMB ADJUVANTED 50 MCG/0.5ML IM SUSR: 0.5000 mL | Freq: Once | INTRAMUSCULAR | 1 refills | Status: AC

## 2019-12-13 MED ORDER — DULOXETINE HCL 30 MG PO CSDR: 30.0000 mg | DELAYED_RELEASE_CAPSULE | Freq: Every day | ORAL | 0 refills | Status: DC

## 2019-12-13 NOTE — Telephone Encounter (Signed)
Pt would like for you to write her a prescription for Shingrix to the [facility] on [address].  Pt also would like you to send in a 90 day supply of Duloxetine 30 mg pt states she is working to decrease her dose to every other day due to her osteoporosis.

## 2019-12-20 DIAGNOSIS — I8312 Varicose veins of left lower extremity with inflammation: Secondary | ICD-10-CM | POA: Diagnosis not present

## 2019-12-20 DIAGNOSIS — I8311 Varicose veins of right lower extremity with inflammation: Secondary | ICD-10-CM | POA: Diagnosis not present

## 2019-12-20 DIAGNOSIS — M9903 Segmental and somatic dysfunction of lumbar region: Secondary | ICD-10-CM | POA: Diagnosis not present

## 2019-12-20 DIAGNOSIS — M9905 Segmental and somatic dysfunction of pelvic region: Secondary | ICD-10-CM | POA: Diagnosis not present

## 2019-12-20 DIAGNOSIS — M9901 Segmental and somatic dysfunction of cervical region: Secondary | ICD-10-CM | POA: Diagnosis not present

## 2019-12-20 DIAGNOSIS — M9902 Segmental and somatic dysfunction of thoracic region: Secondary | ICD-10-CM | POA: Diagnosis not present

## 2019-12-21 DIAGNOSIS — I8312 Varicose veins of left lower extremity with inflammation: Secondary | ICD-10-CM | POA: Diagnosis not present

## 2019-12-21 DIAGNOSIS — I8311 Varicose veins of right lower extremity with inflammation: Secondary | ICD-10-CM | POA: Diagnosis not present

## 2019-12-27 DIAGNOSIS — R232 Flushing: Secondary | ICD-10-CM | POA: Diagnosis not present

## 2019-12-27 DIAGNOSIS — G479 Sleep disorder, unspecified: Secondary | ICD-10-CM | POA: Diagnosis not present

## 2019-12-27 DIAGNOSIS — N951 Menopausal and female climacteric states: Secondary | ICD-10-CM | POA: Diagnosis not present

## 2019-12-29 DIAGNOSIS — N951 Menopausal and female climacteric states: Secondary | ICD-10-CM | POA: Diagnosis not present

## 2019-12-29 DIAGNOSIS — G479 Sleep disorder, unspecified: Secondary | ICD-10-CM | POA: Diagnosis not present

## 2019-12-30 DIAGNOSIS — M9905 Segmental and somatic dysfunction of pelvic region: Secondary | ICD-10-CM | POA: Diagnosis not present

## 2019-12-30 DIAGNOSIS — M9902 Segmental and somatic dysfunction of thoracic region: Secondary | ICD-10-CM | POA: Diagnosis not present

## 2019-12-30 DIAGNOSIS — M9903 Segmental and somatic dysfunction of lumbar region: Secondary | ICD-10-CM | POA: Diagnosis not present

## 2019-12-30 DIAGNOSIS — M9901 Segmental and somatic dysfunction of cervical region: Secondary | ICD-10-CM | POA: Diagnosis not present

## 2020-01-10 DIAGNOSIS — I8312 Varicose veins of left lower extremity with inflammation: Secondary | ICD-10-CM | POA: Diagnosis not present

## 2020-01-10 DIAGNOSIS — I8311 Varicose veins of right lower extremity with inflammation: Secondary | ICD-10-CM | POA: Diagnosis not present

## 2020-01-16 ENCOUNTER — Encounter: Payer: Self-pay | Admitting: Family Medicine

## 2020-01-16 DIAGNOSIS — K591 Functional diarrhea: Secondary | ICD-10-CM | POA: Diagnosis not present

## 2020-01-16 DIAGNOSIS — K582 Mixed irritable bowel syndrome: Secondary | ICD-10-CM | POA: Diagnosis not present

## 2020-01-23 DIAGNOSIS — M9901 Segmental and somatic dysfunction of cervical region: Secondary | ICD-10-CM | POA: Diagnosis not present

## 2020-01-23 DIAGNOSIS — M9905 Segmental and somatic dysfunction of pelvic region: Secondary | ICD-10-CM | POA: Diagnosis not present

## 2020-01-23 DIAGNOSIS — M9903 Segmental and somatic dysfunction of lumbar region: Secondary | ICD-10-CM | POA: Diagnosis not present

## 2020-01-23 DIAGNOSIS — M9902 Segmental and somatic dysfunction of thoracic region: Secondary | ICD-10-CM | POA: Diagnosis not present

## 2020-02-15 DIAGNOSIS — I8312 Varicose veins of left lower extremity with inflammation: Secondary | ICD-10-CM | POA: Diagnosis not present

## 2020-02-20 DIAGNOSIS — M9903 Segmental and somatic dysfunction of lumbar region: Secondary | ICD-10-CM | POA: Diagnosis not present

## 2020-02-20 DIAGNOSIS — M9901 Segmental and somatic dysfunction of cervical region: Secondary | ICD-10-CM | POA: Diagnosis not present

## 2020-02-20 DIAGNOSIS — M9905 Segmental and somatic dysfunction of pelvic region: Secondary | ICD-10-CM | POA: Diagnosis not present

## 2020-02-20 DIAGNOSIS — M9902 Segmental and somatic dysfunction of thoracic region: Secondary | ICD-10-CM | POA: Diagnosis not present

## 2020-02-21 DIAGNOSIS — I8311 Varicose veins of right lower extremity with inflammation: Secondary | ICD-10-CM | POA: Diagnosis not present

## 2020-02-24 DIAGNOSIS — I8312 Varicose veins of left lower extremity with inflammation: Secondary | ICD-10-CM | POA: Diagnosis not present

## 2020-02-24 DIAGNOSIS — I83812 Varicose veins of left lower extremities with pain: Secondary | ICD-10-CM | POA: Diagnosis not present

## 2020-02-28 DIAGNOSIS — N951 Menopausal and female climacteric states: Secondary | ICD-10-CM | POA: Diagnosis not present

## 2020-03-01 DIAGNOSIS — N951 Menopausal and female climacteric states: Secondary | ICD-10-CM | POA: Diagnosis not present

## 2020-03-01 DIAGNOSIS — R69 Illness, unspecified: Secondary | ICD-10-CM | POA: Diagnosis not present

## 2020-03-01 DIAGNOSIS — M810 Age-related osteoporosis without current pathological fracture: Secondary | ICD-10-CM | POA: Diagnosis not present

## 2020-03-01 DIAGNOSIS — R232 Flushing: Secondary | ICD-10-CM | POA: Diagnosis not present

## 2020-03-02 DIAGNOSIS — I8311 Varicose veins of right lower extremity with inflammation: Secondary | ICD-10-CM | POA: Diagnosis not present

## 2020-03-12 DIAGNOSIS — R634 Abnormal weight loss: Secondary | ICD-10-CM | POA: Diagnosis not present

## 2020-03-12 DIAGNOSIS — K591 Functional diarrhea: Secondary | ICD-10-CM | POA: Diagnosis not present

## 2020-03-13 DIAGNOSIS — M9905 Segmental and somatic dysfunction of pelvic region: Secondary | ICD-10-CM | POA: Diagnosis not present

## 2020-03-13 DIAGNOSIS — M9902 Segmental and somatic dysfunction of thoracic region: Secondary | ICD-10-CM | POA: Diagnosis not present

## 2020-03-13 DIAGNOSIS — I8311 Varicose veins of right lower extremity with inflammation: Secondary | ICD-10-CM | POA: Diagnosis not present

## 2020-03-13 DIAGNOSIS — M9901 Segmental and somatic dysfunction of cervical region: Secondary | ICD-10-CM | POA: Diagnosis not present

## 2020-03-13 DIAGNOSIS — M9903 Segmental and somatic dysfunction of lumbar region: Secondary | ICD-10-CM | POA: Diagnosis not present

## 2020-03-20 DIAGNOSIS — I8311 Varicose veins of right lower extremity with inflammation: Secondary | ICD-10-CM | POA: Diagnosis not present

## 2020-03-20 DIAGNOSIS — M79651 Pain in right thigh: Secondary | ICD-10-CM | POA: Diagnosis not present

## 2020-03-30 DIAGNOSIS — I8312 Varicose veins of left lower extremity with inflammation: Secondary | ICD-10-CM | POA: Diagnosis not present

## 2020-04-02 DIAGNOSIS — M9901 Segmental and somatic dysfunction of cervical region: Secondary | ICD-10-CM | POA: Diagnosis not present

## 2020-04-02 DIAGNOSIS — M9902 Segmental and somatic dysfunction of thoracic region: Secondary | ICD-10-CM | POA: Diagnosis not present

## 2020-04-02 DIAGNOSIS — M9903 Segmental and somatic dysfunction of lumbar region: Secondary | ICD-10-CM | POA: Diagnosis not present

## 2020-04-02 DIAGNOSIS — M9905 Segmental and somatic dysfunction of pelvic region: Secondary | ICD-10-CM | POA: Diagnosis not present

## 2020-04-11 ENCOUNTER — Encounter: Payer: Self-pay | Admitting: *Deleted

## 2020-04-13 DIAGNOSIS — I8311 Varicose veins of right lower extremity with inflammation: Secondary | ICD-10-CM | POA: Diagnosis not present

## 2020-04-24 DIAGNOSIS — I8312 Varicose veins of left lower extremity with inflammation: Secondary | ICD-10-CM | POA: Diagnosis not present

## 2020-04-24 DIAGNOSIS — M7981 Nontraumatic hematoma of soft tissue: Secondary | ICD-10-CM | POA: Diagnosis not present

## 2020-04-30 DIAGNOSIS — M9901 Segmental and somatic dysfunction of cervical region: Secondary | ICD-10-CM | POA: Diagnosis not present

## 2020-04-30 DIAGNOSIS — M9905 Segmental and somatic dysfunction of pelvic region: Secondary | ICD-10-CM | POA: Diagnosis not present

## 2020-04-30 DIAGNOSIS — M9903 Segmental and somatic dysfunction of lumbar region: Secondary | ICD-10-CM | POA: Diagnosis not present

## 2020-04-30 DIAGNOSIS — M9902 Segmental and somatic dysfunction of thoracic region: Secondary | ICD-10-CM | POA: Diagnosis not present

## 2020-05-16 DIAGNOSIS — M9903 Segmental and somatic dysfunction of lumbar region: Secondary | ICD-10-CM | POA: Diagnosis not present

## 2020-05-16 DIAGNOSIS — M9905 Segmental and somatic dysfunction of pelvic region: Secondary | ICD-10-CM | POA: Diagnosis not present

## 2020-05-16 DIAGNOSIS — M9902 Segmental and somatic dysfunction of thoracic region: Secondary | ICD-10-CM | POA: Diagnosis not present

## 2020-05-16 DIAGNOSIS — M9901 Segmental and somatic dysfunction of cervical region: Secondary | ICD-10-CM | POA: Diagnosis not present

## 2020-05-21 DIAGNOSIS — N959 Unspecified menopausal and perimenopausal disorder: Secondary | ICD-10-CM | POA: Diagnosis not present

## 2020-05-21 DIAGNOSIS — K219 Gastro-esophageal reflux disease without esophagitis: Secondary | ICD-10-CM | POA: Diagnosis not present

## 2020-05-21 DIAGNOSIS — B001 Herpesviral vesicular dermatitis: Secondary | ICD-10-CM | POA: Diagnosis not present

## 2020-05-21 DIAGNOSIS — E039 Hypothyroidism, unspecified: Secondary | ICD-10-CM | POA: Diagnosis not present

## 2020-05-21 DIAGNOSIS — R69 Illness, unspecified: Secondary | ICD-10-CM | POA: Diagnosis not present

## 2020-05-23 DIAGNOSIS — M7981 Nontraumatic hematoma of soft tissue: Secondary | ICD-10-CM | POA: Diagnosis not present

## 2020-05-23 DIAGNOSIS — I8312 Varicose veins of left lower extremity with inflammation: Secondary | ICD-10-CM | POA: Diagnosis not present

## 2020-06-05 DIAGNOSIS — N951 Menopausal and female climacteric states: Secondary | ICD-10-CM | POA: Diagnosis not present

## 2020-06-06 DIAGNOSIS — M9901 Segmental and somatic dysfunction of cervical region: Secondary | ICD-10-CM | POA: Diagnosis not present

## 2020-06-06 DIAGNOSIS — M9905 Segmental and somatic dysfunction of pelvic region: Secondary | ICD-10-CM | POA: Diagnosis not present

## 2020-06-06 DIAGNOSIS — M9902 Segmental and somatic dysfunction of thoracic region: Secondary | ICD-10-CM | POA: Diagnosis not present

## 2020-06-06 DIAGNOSIS — M9903 Segmental and somatic dysfunction of lumbar region: Secondary | ICD-10-CM | POA: Diagnosis not present

## 2020-06-07 DIAGNOSIS — M810 Age-related osteoporosis without current pathological fracture: Secondary | ICD-10-CM | POA: Diagnosis not present

## 2020-06-07 DIAGNOSIS — E559 Vitamin D deficiency, unspecified: Secondary | ICD-10-CM | POA: Diagnosis not present

## 2020-06-07 DIAGNOSIS — R69 Illness, unspecified: Secondary | ICD-10-CM | POA: Diagnosis not present

## 2020-06-07 DIAGNOSIS — R232 Flushing: Secondary | ICD-10-CM | POA: Diagnosis not present

## 2020-06-07 DIAGNOSIS — Z681 Body mass index (BMI) 19 or less, adult: Secondary | ICD-10-CM | POA: Diagnosis not present

## 2020-06-07 DIAGNOSIS — N951 Menopausal and female climacteric states: Secondary | ICD-10-CM | POA: Diagnosis not present

## 2020-06-09 DIAGNOSIS — Z0100 Encounter for examination of eyes and vision without abnormal findings: Secondary | ICD-10-CM | POA: Diagnosis not present

## 2020-06-11 DIAGNOSIS — Z681 Body mass index (BMI) 19 or less, adult: Secondary | ICD-10-CM | POA: Diagnosis not present

## 2020-06-11 DIAGNOSIS — N760 Acute vaginitis: Secondary | ICD-10-CM | POA: Diagnosis not present

## 2020-06-11 DIAGNOSIS — Z01419 Encounter for gynecological examination (general) (routine) without abnormal findings: Secondary | ICD-10-CM | POA: Diagnosis not present

## 2020-06-18 DIAGNOSIS — M9902 Segmental and somatic dysfunction of thoracic region: Secondary | ICD-10-CM | POA: Diagnosis not present

## 2020-06-18 DIAGNOSIS — M9901 Segmental and somatic dysfunction of cervical region: Secondary | ICD-10-CM | POA: Diagnosis not present

## 2020-06-18 DIAGNOSIS — M9903 Segmental and somatic dysfunction of lumbar region: Secondary | ICD-10-CM | POA: Diagnosis not present

## 2020-06-18 DIAGNOSIS — M9905 Segmental and somatic dysfunction of pelvic region: Secondary | ICD-10-CM | POA: Diagnosis not present

## 2020-06-19 DIAGNOSIS — R3915 Urgency of urination: Secondary | ICD-10-CM | POA: Diagnosis not present

## 2020-06-19 DIAGNOSIS — N393 Stress incontinence (female) (male): Secondary | ICD-10-CM | POA: Diagnosis not present

## 2020-06-19 DIAGNOSIS — R531 Weakness: Secondary | ICD-10-CM | POA: Diagnosis not present

## 2020-06-29 ENCOUNTER — Other Ambulatory Visit: Payer: Medicare HMO

## 2020-06-29 ENCOUNTER — Other Ambulatory Visit: Payer: Self-pay

## 2020-06-29 DIAGNOSIS — Z20822 Contact with and (suspected) exposure to covid-19: Secondary | ICD-10-CM | POA: Diagnosis not present

## 2020-07-01 LAB — NOVEL CORONAVIRUS, NAA: SARS-CoV-2, NAA: NOT DETECTED

## 2020-07-01 LAB — SARS-COV-2, NAA 2 DAY TAT

## 2020-07-01 LAB — SPECIMEN STATUS REPORT

## 2020-07-24 DIAGNOSIS — M9901 Segmental and somatic dysfunction of cervical region: Secondary | ICD-10-CM | POA: Diagnosis not present

## 2020-07-24 DIAGNOSIS — M9905 Segmental and somatic dysfunction of pelvic region: Secondary | ICD-10-CM | POA: Diagnosis not present

## 2020-07-24 DIAGNOSIS — M9902 Segmental and somatic dysfunction of thoracic region: Secondary | ICD-10-CM | POA: Diagnosis not present

## 2020-07-24 DIAGNOSIS — M9903 Segmental and somatic dysfunction of lumbar region: Secondary | ICD-10-CM | POA: Diagnosis not present

## 2020-08-07 DIAGNOSIS — E611 Iron deficiency: Secondary | ICD-10-CM | POA: Diagnosis not present

## 2020-08-07 DIAGNOSIS — E039 Hypothyroidism, unspecified: Secondary | ICD-10-CM | POA: Diagnosis not present

## 2020-08-07 DIAGNOSIS — D649 Anemia, unspecified: Secondary | ICD-10-CM | POA: Diagnosis not present

## 2020-08-13 DIAGNOSIS — R87615 Unsatisfactory cytologic smear of cervix: Secondary | ICD-10-CM | POA: Diagnosis not present

## 2020-08-13 DIAGNOSIS — Z124 Encounter for screening for malignant neoplasm of cervix: Secondary | ICD-10-CM | POA: Diagnosis not present

## 2020-08-14 DIAGNOSIS — E611 Iron deficiency: Secondary | ICD-10-CM | POA: Diagnosis not present

## 2020-08-14 DIAGNOSIS — K219 Gastro-esophageal reflux disease without esophagitis: Secondary | ICD-10-CM | POA: Diagnosis not present

## 2020-08-14 DIAGNOSIS — E039 Hypothyroidism, unspecified: Secondary | ICD-10-CM | POA: Diagnosis not present

## 2020-08-14 DIAGNOSIS — Z0000 Encounter for general adult medical examination without abnormal findings: Secondary | ICD-10-CM | POA: Diagnosis not present

## 2020-08-14 DIAGNOSIS — R82998 Other abnormal findings in urine: Secondary | ICD-10-CM | POA: Diagnosis not present

## 2020-09-04 DIAGNOSIS — E559 Vitamin D deficiency, unspecified: Secondary | ICD-10-CM | POA: Diagnosis not present

## 2020-09-04 DIAGNOSIS — N951 Menopausal and female climacteric states: Secondary | ICD-10-CM | POA: Diagnosis not present

## 2020-09-06 DIAGNOSIS — R232 Flushing: Secondary | ICD-10-CM | POA: Diagnosis not present

## 2020-09-06 DIAGNOSIS — Z681 Body mass index (BMI) 19 or less, adult: Secondary | ICD-10-CM | POA: Diagnosis not present

## 2020-09-06 DIAGNOSIS — M810 Age-related osteoporosis without current pathological fracture: Secondary | ICD-10-CM | POA: Diagnosis not present

## 2020-09-06 DIAGNOSIS — N951 Menopausal and female climacteric states: Secondary | ICD-10-CM | POA: Diagnosis not present

## 2020-09-13 DIAGNOSIS — M9905 Segmental and somatic dysfunction of pelvic region: Secondary | ICD-10-CM | POA: Diagnosis not present

## 2020-09-13 DIAGNOSIS — M9903 Segmental and somatic dysfunction of lumbar region: Secondary | ICD-10-CM | POA: Diagnosis not present

## 2020-09-13 DIAGNOSIS — M9901 Segmental and somatic dysfunction of cervical region: Secondary | ICD-10-CM | POA: Diagnosis not present

## 2020-09-13 DIAGNOSIS — M9902 Segmental and somatic dysfunction of thoracic region: Secondary | ICD-10-CM | POA: Diagnosis not present

## 2020-09-24 DIAGNOSIS — Z9889 Other specified postprocedural states: Secondary | ICD-10-CM | POA: Diagnosis not present

## 2020-09-24 DIAGNOSIS — H52203 Unspecified astigmatism, bilateral: Secondary | ICD-10-CM | POA: Diagnosis not present

## 2020-09-24 DIAGNOSIS — Z8669 Personal history of other diseases of the nervous system and sense organs: Secondary | ICD-10-CM | POA: Diagnosis not present

## 2020-09-24 DIAGNOSIS — H524 Presbyopia: Secondary | ICD-10-CM | POA: Diagnosis not present

## 2020-09-24 DIAGNOSIS — H5213 Myopia, bilateral: Secondary | ICD-10-CM | POA: Diagnosis not present

## 2020-09-24 DIAGNOSIS — H2513 Age-related nuclear cataract, bilateral: Secondary | ICD-10-CM | POA: Diagnosis not present

## 2020-10-03 ENCOUNTER — Ambulatory Visit (INDEPENDENT_AMBULATORY_CARE_PROVIDER_SITE_OTHER): Payer: Medicare HMO | Admitting: Podiatry

## 2020-10-03 ENCOUNTER — Other Ambulatory Visit: Payer: Self-pay

## 2020-10-03 ENCOUNTER — Ambulatory Visit (INDEPENDENT_AMBULATORY_CARE_PROVIDER_SITE_OTHER): Payer: Medicare HMO

## 2020-10-03 DIAGNOSIS — M722 Plantar fascial fibromatosis: Secondary | ICD-10-CM

## 2020-10-03 DIAGNOSIS — Q667 Congenital pes cavus, unspecified foot: Secondary | ICD-10-CM | POA: Diagnosis not present

## 2020-10-04 ENCOUNTER — Telehealth: Payer: Self-pay | Admitting: Podiatry

## 2020-10-04 NOTE — Telephone Encounter (Signed)
Patient called our office stating that she would like to be referred to [facility] for her plantar fasciitis.

## 2020-10-04 NOTE — Telephone Encounter (Signed)
Ammie can you refer this patient to the pharmacy that she has listed.  [facility].  Thank you

## 2020-10-05 ENCOUNTER — Encounter: Payer: Self-pay | Admitting: Podiatry

## 2020-10-05 NOTE — Progress Notes (Signed)
Subjective:  Patient ID: Shannon Adkins, female    DOB: Sep 16, 1950,  MRN: [id number]  Chief Complaint  Patient presents with  . Plantar Fasciitis    Bilateral foot pain    70 y.o. female presents with the above complaint.  Patient presents with complaint of bilateral plantar fasciitis.  Patient states been going on since November.  It has been painful to touch painful to walk on.  She experiences post static dyskinesia type of symptoms.  She denies any treatment options she is tried icing it and some stretches none of which has helped.  She would like to discuss treatment options for this.  Her pain scale is 7 out of 10 this morning shooting in sensation.   Review of Systems: Negative except as noted in the HPI. Denies N/V/F/Ch.  Past Medical History:  Diagnosis Date  . Allergy    Allegra, Flonase  . Anxiety   . GERD (gastroesophageal reflux disease)   . Herpes labialis   . Hypothyroidism   . IBS (irritable bowel syndrome)   . Right wrist fracture   . Thyroid disease     Current Outpatient Medications:  .  thyroid (NP THYROID) 60 MG tablet, 1 tablet on an empty stomach, Disp: , Rfl:  .  Zoster Vaccine Adjuvanted [facility]) injection, See admin instructions., Disp: , Rfl:  .  alendronate (FOSAMAX) 70 MG tablet, Take 1 tablet (70 mg total) by mouth every 7 (seven) days. Take with a full glass of water on an empty stomach., Disp: 12 tablet, Rfl: 3 .  Ascorbic Acid (VITAMIN C) 1000 MG tablet, Take 1,000 mg by mouth daily., Disp: , Rfl:  .  b complex vitamins tablet, Take 1 tablet by mouth daily., Disp: , Rfl:  .  DULoxetine HCl 30 MG CSDR, Take 30 mg by mouth daily., Disp: 90 capsule, Rfl: 0 .  fluticasone (FLONASE) 50 MCG/ACT nasal spray, Place 2 sprays into both nostrils daily., Disp: 48 g, Rfl: 3 .  nystatin-triamcinolone (MYCOLOG II) cream, Apply topically 2 (two) times daily., Disp: , Rfl:  .  progesterone (PROMETRIUM) 200 MG capsule, See admin instructions., Disp: ,  Rfl:  .  propranolol (INDERAL) 20 MG tablet, Take 1 tablet (20 mg total) by mouth as needed., Disp: 90 tablet, Rfl: 1 .  traZODone (DESYREL) 50 MG tablet, Take 0.5-1 tablets (25-50 mg total) by mouth at bedtime as needed for sleep., Disp: 90 tablet, Rfl: 1 .  valACYclovir (VALTREX) 500 MG tablet, Take 2 tablets (1,000 mg total) by mouth daily., Disp: 90 tablet, Rfl: 0  Social History   Tobacco Use  Smoking Status Never Smoker  Smokeless Tobacco Never Used    Allergies  Allergen Reactions  . Amoxicillin Hives    Pt called in to report on 07/31/11  . Lactose Intolerance (Gi)   . Penicillins Hives  . Sulfa Antibiotics Other (See Comments)  . Sulfamethoxazole-Trimethoprim     REACTION: conjunctivitis   Objective:  There were no vitals filed for this visit. There is no height or weight on file to calculate BMI. Constitutional Well developed. Well nourished.  Vascular Dorsalis pedis pulses palpable bilaterally. Posterior tibial pulses palpable bilaterally. Capillary refill normal to all digits.  No cyanosis or clubbing noted. Pedal hair growth normal.  Neurologic Normal speech. Oriented to person, place, and time. Epicritic sensation to light touch grossly present bilaterally.  Dermatologic Nails well groomed and normal in appearance. No open wounds. No skin lesions.  Orthopedic: Normal joint ROM without pain or crepitus  bilaterally. No visible deformities. Tender to palpation at the calcaneal tuber bilaterally. No pain with calcaneal squeeze bilaterally. Ankle ROM diminished range of motion bilaterally. Silfverskiold Test: positive bilaterally.   Radiographs: Taken and reviewed. No acute fractures or dislocations. No evidence of stress fracture.  Plantar heel spur absent. Posterior heel spur present.  Patient is decreasing In inclination angle increasing talar declination angle.  Cavus foot structure noted.  Assessment:   1. Plantar fasciitis of right foot   2. Plantar  fasciitis of left foot   3. Pes cavus    Plan:  Patient was evaluated and treated and all questions answered.  Plantar Fasciitis, bilaterally - XR reviewed as above.  - Educated on icing and stretching. Instructions given.  - Injection delivered to the plantar fascia as below. - DME: Plantar Fascial Brace - Pharmacologic management: None  Pes cavus -I spent in patient the etiology of pes cavus foot structure treatment options were discussed.  Given her current foot structure is putting a lot of tightness to the plantar fascia leading to pulling to the calcaneus and therefore the pain.  I discussed with the patient in extensive detail benefit of orthotics.  She would like to obtain orthotics. -Also discussed financial constriction of the orthotics as well with the patient.   Procedure: Injection Tendon/Ligament Location: Bilateral plantar fascia at the glabrous junction; medial approach. Skin Prep: alcohol Injectate: 0.5 cc 0.5% marcaine plain, 0.5 cc of 1% Lidocaine, 0.5 cc kenalog 10. Disposition: Patient tolerated procedure well. Injection site dressed with a band-aid.  No follow-ups on file.

## 2020-10-08 ENCOUNTER — Other Ambulatory Visit: Payer: Self-pay | Admitting: *Deleted

## 2020-10-08 DIAGNOSIS — M722 Plantar fascial fibromatosis: Secondary | ICD-10-CM

## 2020-10-08 NOTE — Telephone Encounter (Signed)
Called patient and informed per Vmessage  that referral to [facility] has been sent.,received confirmation of fax.

## 2020-10-18 DIAGNOSIS — Z0100 Encounter for examination of eyes and vision without abnormal findings: Secondary | ICD-10-CM | POA: Diagnosis not present

## 2020-10-24 DIAGNOSIS — M9905 Segmental and somatic dysfunction of pelvic region: Secondary | ICD-10-CM | POA: Diagnosis not present

## 2020-10-24 DIAGNOSIS — M9903 Segmental and somatic dysfunction of lumbar region: Secondary | ICD-10-CM | POA: Diagnosis not present

## 2020-10-24 DIAGNOSIS — M9902 Segmental and somatic dysfunction of thoracic region: Secondary | ICD-10-CM | POA: Diagnosis not present

## 2020-10-24 DIAGNOSIS — M9901 Segmental and somatic dysfunction of cervical region: Secondary | ICD-10-CM | POA: Diagnosis not present

## 2020-10-31 DIAGNOSIS — M722 Plantar fascial fibromatosis: Secondary | ICD-10-CM | POA: Diagnosis not present

## 2020-11-06 DIAGNOSIS — M9902 Segmental and somatic dysfunction of thoracic region: Secondary | ICD-10-CM | POA: Diagnosis not present

## 2020-11-06 DIAGNOSIS — M9905 Segmental and somatic dysfunction of pelvic region: Secondary | ICD-10-CM | POA: Diagnosis not present

## 2020-11-06 DIAGNOSIS — M9903 Segmental and somatic dysfunction of lumbar region: Secondary | ICD-10-CM | POA: Diagnosis not present

## 2020-11-06 DIAGNOSIS — M722 Plantar fascial fibromatosis: Secondary | ICD-10-CM | POA: Diagnosis not present

## 2020-11-06 DIAGNOSIS — M9901 Segmental and somatic dysfunction of cervical region: Secondary | ICD-10-CM | POA: Diagnosis not present

## 2020-11-09 DIAGNOSIS — M722 Plantar fascial fibromatosis: Secondary | ICD-10-CM | POA: Diagnosis not present

## 2020-11-14 DIAGNOSIS — E611 Iron deficiency: Secondary | ICD-10-CM | POA: Diagnosis not present

## 2020-11-14 DIAGNOSIS — E039 Hypothyroidism, unspecified: Secondary | ICD-10-CM | POA: Diagnosis not present

## 2020-11-29 DIAGNOSIS — H2513 Age-related nuclear cataract, bilateral: Secondary | ICD-10-CM | POA: Diagnosis not present

## 2020-11-29 DIAGNOSIS — H25043 Posterior subcapsular polar age-related cataract, bilateral: Secondary | ICD-10-CM | POA: Diagnosis not present

## 2020-11-29 DIAGNOSIS — H2512 Age-related nuclear cataract, left eye: Secondary | ICD-10-CM | POA: Diagnosis not present

## 2020-11-29 DIAGNOSIS — H25013 Cortical age-related cataract, bilateral: Secondary | ICD-10-CM | POA: Diagnosis not present

## 2020-11-29 DIAGNOSIS — H18413 Arcus senilis, bilateral: Secondary | ICD-10-CM | POA: Diagnosis not present

## 2020-12-04 DIAGNOSIS — N951 Menopausal and female climacteric states: Secondary | ICD-10-CM | POA: Diagnosis not present

## 2020-12-06 DIAGNOSIS — R232 Flushing: Secondary | ICD-10-CM | POA: Diagnosis not present

## 2020-12-06 DIAGNOSIS — M2550 Pain in unspecified joint: Secondary | ICD-10-CM | POA: Diagnosis not present

## 2020-12-06 DIAGNOSIS — N951 Menopausal and female climacteric states: Secondary | ICD-10-CM | POA: Diagnosis not present

## 2020-12-06 DIAGNOSIS — Z681 Body mass index (BMI) 19 or less, adult: Secondary | ICD-10-CM | POA: Diagnosis not present

## 2021-02-18 DIAGNOSIS — H2512 Age-related nuclear cataract, left eye: Secondary | ICD-10-CM | POA: Diagnosis not present

## 2021-02-18 DIAGNOSIS — H52202 Unspecified astigmatism, left eye: Secondary | ICD-10-CM | POA: Diagnosis not present

## 2021-02-19 DIAGNOSIS — H2511 Age-related nuclear cataract, right eye: Secondary | ICD-10-CM | POA: Diagnosis not present

## 2021-03-04 DIAGNOSIS — H2511 Age-related nuclear cataract, right eye: Secondary | ICD-10-CM | POA: Diagnosis not present

## 2021-03-05 DIAGNOSIS — N951 Menopausal and female climacteric states: Secondary | ICD-10-CM | POA: Diagnosis not present

## 2021-03-05 DIAGNOSIS — R5383 Other fatigue: Secondary | ICD-10-CM | POA: Diagnosis not present

## 2021-03-07 DIAGNOSIS — Z681 Body mass index (BMI) 19 or less, adult: Secondary | ICD-10-CM | POA: Diagnosis not present

## 2021-03-07 DIAGNOSIS — R5383 Other fatigue: Secondary | ICD-10-CM | POA: Diagnosis not present

## 2021-03-07 DIAGNOSIS — N951 Menopausal and female climacteric states: Secondary | ICD-10-CM | POA: Diagnosis not present

## 2021-03-07 DIAGNOSIS — R232 Flushing: Secondary | ICD-10-CM | POA: Diagnosis not present

## 2021-04-27 DIAGNOSIS — R059 Cough, unspecified: Secondary | ICD-10-CM | POA: Diagnosis not present

## 2021-04-27 DIAGNOSIS — J029 Acute pharyngitis, unspecified: Secondary | ICD-10-CM | POA: Diagnosis not present

## 2021-04-27 DIAGNOSIS — J0100 Acute maxillary sinusitis, unspecified: Secondary | ICD-10-CM | POA: Diagnosis not present

## 2021-06-13 DIAGNOSIS — L292 Pruritus vulvae: Secondary | ICD-10-CM | POA: Diagnosis not present

## 2021-06-13 DIAGNOSIS — N898 Other specified noninflammatory disorders of vagina: Secondary | ICD-10-CM | POA: Diagnosis not present

## 2021-06-24 DIAGNOSIS — M79671 Pain in right foot: Secondary | ICD-10-CM | POA: Diagnosis not present

## 2021-06-24 DIAGNOSIS — M9902 Segmental and somatic dysfunction of thoracic region: Secondary | ICD-10-CM | POA: Diagnosis not present

## 2021-06-24 DIAGNOSIS — M2042 Other hammer toe(s) (acquired), left foot: Secondary | ICD-10-CM | POA: Diagnosis not present

## 2021-06-24 DIAGNOSIS — M25572 Pain in left ankle and joints of left foot: Secondary | ICD-10-CM | POA: Diagnosis not present

## 2021-06-24 DIAGNOSIS — M792 Neuralgia and neuritis, unspecified: Secondary | ICD-10-CM | POA: Diagnosis not present

## 2021-06-24 DIAGNOSIS — M2041 Other hammer toe(s) (acquired), right foot: Secondary | ICD-10-CM | POA: Diagnosis not present

## 2021-06-24 DIAGNOSIS — M79672 Pain in left foot: Secondary | ICD-10-CM | POA: Diagnosis not present

## 2021-06-24 DIAGNOSIS — M9905 Segmental and somatic dysfunction of pelvic region: Secondary | ICD-10-CM | POA: Diagnosis not present

## 2021-06-24 DIAGNOSIS — M9903 Segmental and somatic dysfunction of lumbar region: Secondary | ICD-10-CM | POA: Diagnosis not present

## 2021-06-24 DIAGNOSIS — M9901 Segmental and somatic dysfunction of cervical region: Secondary | ICD-10-CM | POA: Diagnosis not present

## 2021-06-24 DIAGNOSIS — M722 Plantar fascial fibromatosis: Secondary | ICD-10-CM | POA: Diagnosis not present

## 2021-07-15 DIAGNOSIS — M9903 Segmental and somatic dysfunction of lumbar region: Secondary | ICD-10-CM | POA: Diagnosis not present

## 2021-07-15 DIAGNOSIS — M9902 Segmental and somatic dysfunction of thoracic region: Secondary | ICD-10-CM | POA: Diagnosis not present

## 2021-07-15 DIAGNOSIS — M9905 Segmental and somatic dysfunction of pelvic region: Secondary | ICD-10-CM | POA: Diagnosis not present

## 2021-07-15 DIAGNOSIS — M9901 Segmental and somatic dysfunction of cervical region: Secondary | ICD-10-CM | POA: Diagnosis not present

## 2021-07-23 ENCOUNTER — Encounter: Payer: Self-pay | Admitting: Internal Medicine

## 2021-08-05 DIAGNOSIS — M9902 Segmental and somatic dysfunction of thoracic region: Secondary | ICD-10-CM | POA: Diagnosis not present

## 2021-08-05 DIAGNOSIS — M9905 Segmental and somatic dysfunction of pelvic region: Secondary | ICD-10-CM | POA: Diagnosis not present

## 2021-08-05 DIAGNOSIS — M9903 Segmental and somatic dysfunction of lumbar region: Secondary | ICD-10-CM | POA: Diagnosis not present

## 2021-08-05 DIAGNOSIS — M9901 Segmental and somatic dysfunction of cervical region: Secondary | ICD-10-CM | POA: Diagnosis not present

## 2021-08-21 DIAGNOSIS — E039 Hypothyroidism, unspecified: Secondary | ICD-10-CM | POA: Diagnosis not present

## 2021-08-21 DIAGNOSIS — R7989 Other specified abnormal findings of blood chemistry: Secondary | ICD-10-CM | POA: Diagnosis not present

## 2021-08-21 DIAGNOSIS — Z79899 Other long term (current) drug therapy: Secondary | ICD-10-CM | POA: Diagnosis not present

## 2021-08-22 DIAGNOSIS — M9903 Segmental and somatic dysfunction of lumbar region: Secondary | ICD-10-CM | POA: Diagnosis not present

## 2021-08-22 DIAGNOSIS — M9905 Segmental and somatic dysfunction of pelvic region: Secondary | ICD-10-CM | POA: Diagnosis not present

## 2021-08-22 DIAGNOSIS — M9901 Segmental and somatic dysfunction of cervical region: Secondary | ICD-10-CM | POA: Diagnosis not present

## 2021-08-22 DIAGNOSIS — M9902 Segmental and somatic dysfunction of thoracic region: Secondary | ICD-10-CM | POA: Diagnosis not present

## 2021-08-26 DIAGNOSIS — R140 Abdominal distension (gaseous): Secondary | ICD-10-CM | POA: Diagnosis not present

## 2021-08-26 DIAGNOSIS — K580 Irritable bowel syndrome with diarrhea: Secondary | ICD-10-CM | POA: Diagnosis not present

## 2021-08-26 DIAGNOSIS — R143 Flatulence: Secondary | ICD-10-CM | POA: Diagnosis not present

## 2021-08-27 DIAGNOSIS — E611 Iron deficiency: Secondary | ICD-10-CM | POA: Diagnosis not present

## 2021-08-27 DIAGNOSIS — Z1331 Encounter for screening for depression: Secondary | ICD-10-CM | POA: Diagnosis not present

## 2021-08-27 DIAGNOSIS — E039 Hypothyroidism, unspecified: Secondary | ICD-10-CM | POA: Diagnosis not present

## 2021-08-27 DIAGNOSIS — R5383 Other fatigue: Secondary | ICD-10-CM | POA: Diagnosis not present

## 2021-08-27 DIAGNOSIS — K6389 Other specified diseases of intestine: Secondary | ICD-10-CM | POA: Diagnosis not present

## 2021-08-27 DIAGNOSIS — Z1339 Encounter for screening examination for other mental health and behavioral disorders: Secondary | ICD-10-CM | POA: Diagnosis not present

## 2021-08-27 DIAGNOSIS — K219 Gastro-esophageal reflux disease without esophagitis: Secondary | ICD-10-CM | POA: Diagnosis not present

## 2021-08-27 DIAGNOSIS — Z0000 Encounter for general adult medical examination without abnormal findings: Secondary | ICD-10-CM | POA: Diagnosis not present

## 2021-08-28 DIAGNOSIS — R143 Flatulence: Secondary | ICD-10-CM | POA: Diagnosis not present

## 2021-08-28 DIAGNOSIS — K580 Irritable bowel syndrome with diarrhea: Secondary | ICD-10-CM | POA: Diagnosis not present

## 2021-08-28 DIAGNOSIS — R109 Unspecified abdominal pain: Secondary | ICD-10-CM | POA: Diagnosis not present

## 2021-08-28 DIAGNOSIS — R140 Abdominal distension (gaseous): Secondary | ICD-10-CM | POA: Diagnosis not present

## 2021-09-04 DIAGNOSIS — M9903 Segmental and somatic dysfunction of lumbar region: Secondary | ICD-10-CM | POA: Diagnosis not present

## 2021-09-04 DIAGNOSIS — M9902 Segmental and somatic dysfunction of thoracic region: Secondary | ICD-10-CM | POA: Diagnosis not present

## 2021-09-04 DIAGNOSIS — M9905 Segmental and somatic dysfunction of pelvic region: Secondary | ICD-10-CM | POA: Diagnosis not present

## 2021-09-04 DIAGNOSIS — M9901 Segmental and somatic dysfunction of cervical region: Secondary | ICD-10-CM | POA: Diagnosis not present

## 2021-09-11 ENCOUNTER — Ambulatory Visit: Payer: Medicare HMO | Admitting: Internal Medicine

## 2021-09-25 DIAGNOSIS — M9901 Segmental and somatic dysfunction of cervical region: Secondary | ICD-10-CM | POA: Diagnosis not present

## 2021-09-25 DIAGNOSIS — M9905 Segmental and somatic dysfunction of pelvic region: Secondary | ICD-10-CM | POA: Diagnosis not present

## 2021-09-25 DIAGNOSIS — M9903 Segmental and somatic dysfunction of lumbar region: Secondary | ICD-10-CM | POA: Diagnosis not present

## 2021-09-25 DIAGNOSIS — M9902 Segmental and somatic dysfunction of thoracic region: Secondary | ICD-10-CM | POA: Diagnosis not present

## 2021-10-28 DIAGNOSIS — M9902 Segmental and somatic dysfunction of thoracic region: Secondary | ICD-10-CM | POA: Diagnosis not present

## 2021-10-28 DIAGNOSIS — M9901 Segmental and somatic dysfunction of cervical region: Secondary | ICD-10-CM | POA: Diagnosis not present

## 2021-10-28 DIAGNOSIS — M9903 Segmental and somatic dysfunction of lumbar region: Secondary | ICD-10-CM | POA: Diagnosis not present

## 2021-10-28 DIAGNOSIS — M9905 Segmental and somatic dysfunction of pelvic region: Secondary | ICD-10-CM | POA: Diagnosis not present

## 2021-11-19 DIAGNOSIS — G4700 Insomnia, unspecified: Secondary | ICD-10-CM | POA: Diagnosis not present

## 2021-11-19 DIAGNOSIS — R69 Illness, unspecified: Secondary | ICD-10-CM | POA: Diagnosis not present

## 2021-11-19 DIAGNOSIS — F418 Other specified anxiety disorders: Secondary | ICD-10-CM | POA: Diagnosis not present

## 2021-12-30 DIAGNOSIS — M9903 Segmental and somatic dysfunction of lumbar region: Secondary | ICD-10-CM | POA: Diagnosis not present

## 2021-12-30 DIAGNOSIS — M9905 Segmental and somatic dysfunction of pelvic region: Secondary | ICD-10-CM | POA: Diagnosis not present

## 2021-12-30 DIAGNOSIS — M9901 Segmental and somatic dysfunction of cervical region: Secondary | ICD-10-CM | POA: Diagnosis not present

## 2021-12-30 DIAGNOSIS — M9902 Segmental and somatic dysfunction of thoracic region: Secondary | ICD-10-CM | POA: Diagnosis not present

## 2022-01-01 DIAGNOSIS — R5382 Chronic fatigue, unspecified: Secondary | ICD-10-CM | POA: Diagnosis not present

## 2022-01-01 DIAGNOSIS — Z681 Body mass index (BMI) 19 or less, adult: Secondary | ICD-10-CM | POA: Diagnosis not present

## 2022-03-06 DIAGNOSIS — M9903 Segmental and somatic dysfunction of lumbar region: Secondary | ICD-10-CM | POA: Diagnosis not present

## 2022-03-06 DIAGNOSIS — M9901 Segmental and somatic dysfunction of cervical region: Secondary | ICD-10-CM | POA: Diagnosis not present

## 2022-03-06 DIAGNOSIS — M9905 Segmental and somatic dysfunction of pelvic region: Secondary | ICD-10-CM | POA: Diagnosis not present

## 2022-03-06 DIAGNOSIS — M9902 Segmental and somatic dysfunction of thoracic region: Secondary | ICD-10-CM | POA: Diagnosis not present

## 2022-03-07 DIAGNOSIS — Z1211 Encounter for screening for malignant neoplasm of colon: Secondary | ICD-10-CM | POA: Diagnosis not present

## 2022-03-07 DIAGNOSIS — Z880 Allergy status to penicillin: Secondary | ICD-10-CM | POA: Diagnosis not present

## 2022-03-07 DIAGNOSIS — Z882 Allergy status to sulfonamides status: Secondary | ICD-10-CM | POA: Diagnosis not present

## 2022-03-07 DIAGNOSIS — K6389 Other specified diseases of intestine: Secondary | ICD-10-CM | POA: Diagnosis not present

## 2022-03-07 DIAGNOSIS — Z800 Family history of malignant neoplasm of digestive organs: Secondary | ICD-10-CM | POA: Diagnosis not present

## 2022-03-07 DIAGNOSIS — K529 Noninfective gastroenteritis and colitis, unspecified: Secondary | ICD-10-CM | POA: Diagnosis not present

## 2022-03-07 DIAGNOSIS — R197 Diarrhea, unspecified: Secondary | ICD-10-CM | POA: Diagnosis not present

## 2022-03-07 DIAGNOSIS — D72820 Lymphocytosis (symptomatic): Secondary | ICD-10-CM | POA: Diagnosis not present

## 2022-03-07 DIAGNOSIS — Z8371 Family history of colonic polyps: Secondary | ICD-10-CM | POA: Diagnosis not present

## 2022-03-07 DIAGNOSIS — Z86010 Personal history of colonic polyps: Secondary | ICD-10-CM | POA: Diagnosis not present

## 2022-03-07 DIAGNOSIS — Z79890 Hormone replacement therapy (postmenopausal): Secondary | ICD-10-CM | POA: Diagnosis not present

## 2022-04-04 DIAGNOSIS — H26493 Other secondary cataract, bilateral: Secondary | ICD-10-CM | POA: Diagnosis not present

## 2022-04-04 DIAGNOSIS — Z961 Presence of intraocular lens: Secondary | ICD-10-CM | POA: Diagnosis not present

## 2022-05-07 DIAGNOSIS — M9901 Segmental and somatic dysfunction of cervical region: Secondary | ICD-10-CM | POA: Diagnosis not present

## 2022-05-07 DIAGNOSIS — M9902 Segmental and somatic dysfunction of thoracic region: Secondary | ICD-10-CM | POA: Diagnosis not present

## 2022-05-07 DIAGNOSIS — M9905 Segmental and somatic dysfunction of pelvic region: Secondary | ICD-10-CM | POA: Diagnosis not present

## 2022-05-07 DIAGNOSIS — M9903 Segmental and somatic dysfunction of lumbar region: Secondary | ICD-10-CM | POA: Diagnosis not present

## 2022-07-01 ENCOUNTER — Ambulatory Visit: Payer: Medicare HMO | Admitting: Podiatry

## 2022-07-03 DIAGNOSIS — H18413 Arcus senilis, bilateral: Secondary | ICD-10-CM | POA: Diagnosis not present

## 2022-07-03 DIAGNOSIS — H26493 Other secondary cataract, bilateral: Secondary | ICD-10-CM | POA: Diagnosis not present

## 2022-07-03 DIAGNOSIS — Z961 Presence of intraocular lens: Secondary | ICD-10-CM | POA: Diagnosis not present

## 2022-07-03 DIAGNOSIS — H33322 Round hole, left eye: Secondary | ICD-10-CM | POA: Diagnosis not present

## 2022-07-03 DIAGNOSIS — H26491 Other secondary cataract, right eye: Secondary | ICD-10-CM | POA: Diagnosis not present

## 2022-07-08 DIAGNOSIS — H26492 Other secondary cataract, left eye: Secondary | ICD-10-CM | POA: Diagnosis not present

## 2022-07-10 DIAGNOSIS — M9902 Segmental and somatic dysfunction of thoracic region: Secondary | ICD-10-CM | POA: Diagnosis not present

## 2022-07-10 DIAGNOSIS — M9901 Segmental and somatic dysfunction of cervical region: Secondary | ICD-10-CM | POA: Diagnosis not present

## 2022-07-10 DIAGNOSIS — M9905 Segmental and somatic dysfunction of pelvic region: Secondary | ICD-10-CM | POA: Diagnosis not present

## 2022-07-10 DIAGNOSIS — M9903 Segmental and somatic dysfunction of lumbar region: Secondary | ICD-10-CM | POA: Diagnosis not present

## 2022-07-15 ENCOUNTER — Ambulatory Visit: Payer: Medicare HMO | Admitting: Podiatry

## 2022-07-21 ENCOUNTER — Ambulatory Visit: Payer: Medicare HMO | Admitting: Podiatry

## 2022-07-21 ENCOUNTER — Ambulatory Visit (INDEPENDENT_AMBULATORY_CARE_PROVIDER_SITE_OTHER): Payer: Medicare HMO

## 2022-07-21 DIAGNOSIS — M79671 Pain in right foot: Secondary | ICD-10-CM

## 2022-07-21 DIAGNOSIS — M2042 Other hammer toe(s) (acquired), left foot: Secondary | ICD-10-CM

## 2022-07-21 DIAGNOSIS — M2041 Other hammer toe(s) (acquired), right foot: Secondary | ICD-10-CM

## 2022-07-21 DIAGNOSIS — L909 Atrophic disorder of skin, unspecified: Secondary | ICD-10-CM

## 2022-07-21 DIAGNOSIS — M722 Plantar fascial fibromatosis: Secondary | ICD-10-CM

## 2022-07-21 DIAGNOSIS — M79672 Pain in left foot: Secondary | ICD-10-CM | POA: Diagnosis not present

## 2022-07-21 NOTE — Progress Notes (Signed)
Subjective: Chief Complaint  Patient presents with   Foot Pain    Bilateral feet, patient has been experiencing pain in the heels since 2021, pain is pretty constant     72 year old female presents the office with above concerns.  The left heel always hurts and the pads of the foot hurt. The injection helped previously, the brace did not help. No other treatment. She thinks that getting dehydrated may have something to do it. She wears bedroom slippers without orthotics which aggreviates it.  Massaging helps.   She has a scololodsis and she gets a callus sub 5 right   H/o left ankle sprain 3.5 years ago, keeps "acting up". No treatment. She wore a wrap on it.   Objective: AAO x3, NAD DP/PT pulses palpable bilaterally, CRT less than 3 seconds There is tenderness palpation along the plantar aspect left heel along the insertion of plantar fascia.  There is no pain with lateral compression of the calcaneus.  No pain with Achilles tendon.  There is atrophy of the fat pad noted to the heels, submetatarsal bilaterally.  There is no significant ankle instability noted today.  No area pinpoint tenderness. Hammertoes, flexible right > left No pain with calf compression, swelling, warmth, erythema  Assessment: 72 year old female with plantar fasciitis, history of ankle sprain; fat pad atrophy  Plan: -All treatment options discussed with the patient including all alternatives, risks, complications.  -X-rays were obtained and reviewed bilaterally.  3 views of the feet were obtained.  No evidence of acute fracture.  Osteopenia present. -Regards to her heel pain I do think she will benefit from physical therapy to help with this and also the ankles.  She had previously.  Referral was placed.  I like to hold off on another injection given the anterior fat pad already.  We discussed shoes, arch support.  Discussed topical creams that she can use as well.  Gel heel cups.  May benefit from orthotics to help  cushion as well and provide support.  -Discussed hydration -Patient encouraged to call the office with any questions, concerns, change in symptoms.   Trula Slade DPM

## 2022-07-21 NOTE — Patient Instructions (Signed)
Plantar Fasciitis (Heel Spur Syndrome) with Rehab The plantar fascia is a fibrous, ligament-like, soft-tissue structure that spans the bottom of the foot. Plantar fasciitis is a condition that causes pain in the foot due to inflammation of the tissue. SYMPTOMS   Pain and tenderness on the underneath side of the foot.  Pain that worsens with standing or walking. CAUSES  Plantar fasciitis is caused by irritation and injury to the plantar fascia on the underneath side of the foot. Common mechanisms of injury include:  Direct trauma to bottom of the foot.  Damage to a small nerve that runs under the foot where the main fascia attaches to the heel bone.  Stress placed on the plantar fascia due to bone spurs. RISK INCREASES WITH:   Activities that place stress on the plantar fascia (running, jumping, pivoting, or cutting).  Poor strength and flexibility.  Improperly fitted shoes.  Tight calf muscles.  Flat feet.  Failure to warm-up properly before activity.  Obesity. PREVENTION  Warm up and stretch properly before activity.  Allow for adequate recovery between workouts.  Maintain physical fitness:  Strength, flexibility, and endurance.  Cardiovascular fitness.  Maintain a health body weight.  Avoid stress on the plantar fascia.  Wear properly fitted shoes, including arch supports for individuals who have flat feet.  PROGNOSIS  If treated properly, then the symptoms of plantar fasciitis usually resolve without surgery. However, occasionally surgery is necessary.  RELATED COMPLICATIONS   Recurrent symptoms that may result in a chronic condition.  Problems of the lower back that are caused by compensating for the injury, such as limping.  Pain or weakness of the foot during push-off following surgery.  Chronic inflammation, scarring, and partial or complete fascia tear, occurring more often from repeated injections.  TREATMENT  Treatment initially involves the  use of ice and medication to help reduce pain and inflammation. The use of strengthening and stretching exercises may help reduce pain with activity, especially stretches of the Achilles tendon. These exercises may be performed at home or with a therapist. Your caregiver may recommend that you use heel cups of arch supports to help reduce stress on the plantar fascia. Occasionally, corticosteroid injections are given to reduce inflammation. If symptoms persist for greater than 6 months despite non-surgical (conservative), then surgery may be recommended.   MEDICATION   If pain medication is necessary, then nonsteroidal anti-inflammatory medications, such as aspirin and ibuprofen, or other minor pain relievers, such as acetaminophen, are often recommended.  Do not take pain medication within 7 days before surgery.  Prescription pain relievers may be given if deemed necessary by your caregiver. Use only as directed and only as much as you need.  Corticosteroid injections may be given by your caregiver. These injections should be reserved for the most serious cases, because they may only be given a certain number of times.  HEAT AND COLD  Cold treatment (icing) relieves pain and reduces inflammation. Cold treatment should be applied for 10 to 15 minutes every 2 to 3 hours for inflammation and pain and immediately after any activity that aggravates your symptoms. Use ice packs or massage the area with a piece of ice (ice massage).  Heat treatment may be used prior to performing the stretching and strengthening activities prescribed by your caregiver, physical therapist, or athletic trainer. Use a heat pack or soak the injury in warm water.  SEEK IMMEDIATE MEDICAL CARE IF:  Treatment seems to offer no benefit, or the condition worsens.  Any medications   produce adverse side effects.  EXERCISES- RANGE OF MOTION (ROM) AND STRETCHING EXERCISES - Plantar Fasciitis (Heel Spur Syndrome) These exercises  may help you when beginning to rehabilitate your injury. Your symptoms may resolve with or without further involvement from your physician, physical therapist or athletic trainer. While completing these exercises, remember:   Restoring tissue flexibility helps normal motion to return to the joints. This allows healthier, less painful movement and activity.  An effective stretch should be held for at least 30 seconds.  A stretch should never be painful. You should only feel a gentle lengthening or release in the stretched tissue.  RANGE OF MOTION - Toe Extension, Flexion  Sit with your right / left leg crossed over your opposite knee.  Grasp your toes and gently pull them back toward the top of your foot. You should feel a stretch on the bottom of your toes and/or foot.  Hold this stretch for 10 seconds.  Now, gently pull your toes toward the bottom of your foot. You should feel a stretch on the top of your toes and or foot.  Hold this stretch for 10 seconds. Repeat  times. Complete this stretch 3 times per day.   RANGE OF MOTION - Ankle Dorsiflexion, Active Assisted  Remove shoes and sit on a chair that is preferably not on a carpeted surface.  Place right / left foot under knee. Extend your opposite leg for support.  Keeping your heel down, slide your right / left foot back toward the chair until you feel a stretch at your ankle or calf. If you do not feel a stretch, slide your bottom forward to the edge of the chair, while still keeping your heel down.  Hold this stretch for 10 seconds. Repeat 3 times. Complete this stretch 2 times per day.   STRETCH  Gastroc, Standing  Place hands on wall.  Extend right / left leg, keeping the front knee somewhat bent.  Slightly point your toes inward on your back foot.  Keeping your right / left heel on the floor and your knee straight, shift your weight toward the wall, not allowing your back to arch.  You should feel a gentle stretch  in the right / left calf. Hold this position for 10 seconds. Repeat 3 times. Complete this stretch 2 times per day.  STRETCH  Soleus, Standing  Place hands on wall.  Extend right / left leg, keeping the other knee somewhat bent.  Slightly point your toes inward on your back foot.  Keep your right / left heel on the floor, bend your back knee, and slightly shift your weight over the back leg so that you feel a gentle stretch deep in your back calf.  Hold this position for 10 seconds. Repeat 3 times. Complete this stretch 2 times per day.  STRETCH  Gastrocsoleus, Standing  Note: This exercise can place a lot of stress on your foot and ankle. Please complete this exercise only if specifically instructed by your caregiver.   Place the ball of your right / left foot on a step, keeping your other foot firmly on the same step.  Hold on to the wall or a rail for balance.  Slowly lift your other foot, allowing your body weight to press your heel down over the edge of the step.  You should feel a stretch in your right / left calf.  Hold this position for 10 seconds.  Repeat this exercise with a slight bend in your right /   gain both the endurance and the strength needed for everyday activities through controlled exercises. Complete these exercises as instructed by your physician, physical therapist or athletic trainer. Progress the resistance and repetitions only as guided.  STRENGTH - Towel Curls Sit in a chair positioned on a non-carpeted surface. Place your foot on a towel, keeping your heel on the floor. Pull the towel toward your heel by only curling your toes. Keep your heel on the floor. Repeat 3 times.  Complete this exercise 2 times per day.  STRENGTH - Ankle Inversion Secure one end of a rubber exercise band/tubing to a fixed object (table, pole). Loop the other end around your foot just before your toes. Place your fists between your knees. This will focus your strengthening at your ankle. Slowly, pull your big toe up and in, making sure the band/tubing is positioned to resist the entire motion. Hold this position for 10 seconds. Have your muscles resist the band/tubing as it slowly pulls your foot back to the starting position. Repeat 3 times. Complete this exercises 2 times per day.  Document Released: 05/26/2005 Document Revised: 08/18/2011 Document Reviewed: 09/07/2008 [employer] Patient Information 2014 [employer], [place].  ---  Ankle Sprain, Phase I Rehab An ankle sprain is an injury to the ligaments of your ankle. Ankle sprains cause stiffness, loss of motion, and loss of strength. Ask your health care provider which exercises are safe for you. Do exercises exactly as told by your health care provider and adjust them as directed. It is normal to feel mild stretching, pulling, tightness, or discomfort as you do these exercises. Stop right away if you feel sudden pain or your pain gets worse. Do not begin these exercises until told by your health care provider. Stretching and range-of-motion exercises These exercises warm up your muscles and joints and improve the movement and flexibility of your lower leg and ankle. These exercises also help to relieve pain and stiffness. Gastroc and soleus stretch This exercise is also called a calf stretch. It stretches the muscles in the back of the lower leg. These muscles are the gastrocnemius, or gastroc, and the soleus. Sit on the floor with your left / right leg extended. Loop a belt or towel around the ball of your left / right foot. The ball of your foot is on the walking surface, right under your toes. Keep your left / right ankle and foot  relaxed and keep your knee straight while you use the belt or towel to pull your foot toward you. You should feel a gentle stretch behind your calf or knee in your gastroc muscle. Hold this position for __________ seconds, then release to the starting position. Repeat the exercise with your knee bent. You can put a pillow or a rolled bath towel under your knee to support it. You should feel a stretch deep in your calf in the soleus muscle or at your Achilles tendon. Repeat __________ times. Complete this exercise __________ times a day. Ankle alphabet  Sit with your left / right leg supported at the lower leg. Do not rest your foot on anything. Make sure your foot has room to move freely. Think of your left / right foot as a paintbrush. Move your foot to trace each letter of the alphabet in the air. Keep your hip and knee still while you trace. Make the letters as large as you can without feeling discomfort. Trace every letter from A to Z. Repeat __________ times. Complete this exercise __________ times a  day. Strengthening exercises These exercises build strength and endurance in your ankle and lower leg. Endurance is the ability to use your muscles for a long time, even after they get tired. Ankle dorsiflexion  Secure a rubber exercise band or tube to an object, such as a table leg, that will stay still when the band is pulled. Secure the other end around your left / right foot. Sit on the floor facing the object, with your left / right leg extended. The band or tube should be slightly tense when your foot is relaxed. Slowly bring your foot toward you, bringing the top of your foot toward your shin (dorsiflexion), and pulling the band tighter. Hold this position for __________ seconds. Slowly return your foot to the starting position. Repeat __________ times. Complete this exercise __________ times a day. Ankle plantar flexion  Sit on the floor with your left / right leg extended. Loop a  rubber exercise tube or band around the ball of your left / right foot. The ball of your foot is on the walking surface, right under your toes. Hold the ends of the band or tube in your hands. The band or tube should be slightly tense when your foot is relaxed. Slowly point your foot and toes downward to tilt the top of your foot away from your shin (plantar flexion). Hold this position for __________ seconds. Slowly return your foot to the starting position. Repeat __________ times. Complete this exercise __________ times a day. Ankle eversion Sit on the floor with your legs straight out in front of you. Loop a rubber exercise band or tube around the ball of your left / right foot. The ball of your foot is on the walking surface, right under your toes. Hold the ends of the band in your hands, or secure the band to a stable object. The band or tube should be slightly tense when your foot is relaxed. Slowly push your foot outward, away from your other leg (eversion). Hold this position for __________ seconds. Slowly return your foot to the starting position. Repeat __________ times. Complete this exercise __________ times a day. This information is not intended to replace advice given to you by your health care provider. Make sure you discuss any questions you have with your health care provider. Document Revised: 07/17/2020 Document Reviewed: 07/19/2020 [employer] Patient Education  [place].

## 2022-07-24 DIAGNOSIS — Z681 Body mass index (BMI) 19 or less, adult: Secondary | ICD-10-CM | POA: Diagnosis not present

## 2022-07-24 DIAGNOSIS — L292 Pruritus vulvae: Secondary | ICD-10-CM | POA: Diagnosis not present

## 2022-07-24 DIAGNOSIS — N898 Other specified noninflammatory disorders of vagina: Secondary | ICD-10-CM | POA: Diagnosis not present

## 2022-07-24 DIAGNOSIS — M816 Localized osteoporosis [Lequesne]: Secondary | ICD-10-CM | POA: Diagnosis not present

## 2022-07-24 DIAGNOSIS — Z01419 Encounter for gynecological examination (general) (routine) without abnormal findings: Secondary | ICD-10-CM | POA: Diagnosis not present

## 2022-07-24 DIAGNOSIS — N958 Other specified menopausal and perimenopausal disorders: Secondary | ICD-10-CM | POA: Diagnosis not present

## 2022-08-20 DIAGNOSIS — N951 Menopausal and female climacteric states: Secondary | ICD-10-CM | POA: Diagnosis not present

## 2022-08-20 DIAGNOSIS — E039 Hypothyroidism, unspecified: Secondary | ICD-10-CM | POA: Diagnosis not present

## 2022-08-25 DIAGNOSIS — Z681 Body mass index (BMI) 19 or less, adult: Secondary | ICD-10-CM | POA: Diagnosis not present

## 2022-08-25 DIAGNOSIS — E039 Hypothyroidism, unspecified: Secondary | ICD-10-CM | POA: Diagnosis not present

## 2022-08-25 DIAGNOSIS — N951 Menopausal and female climacteric states: Secondary | ICD-10-CM | POA: Diagnosis not present

## 2022-08-25 DIAGNOSIS — M810 Age-related osteoporosis without current pathological fracture: Secondary | ICD-10-CM | POA: Diagnosis not present

## 2022-08-25 DIAGNOSIS — N898 Other specified noninflammatory disorders of vagina: Secondary | ICD-10-CM | POA: Diagnosis not present

## 2022-08-25 DIAGNOSIS — R5382 Chronic fatigue, unspecified: Secondary | ICD-10-CM | POA: Diagnosis not present

## 2022-08-26 DIAGNOSIS — R5382 Chronic fatigue, unspecified: Secondary | ICD-10-CM | POA: Diagnosis not present

## 2022-09-10 DIAGNOSIS — E039 Hypothyroidism, unspecified: Secondary | ICD-10-CM | POA: Diagnosis not present

## 2022-09-10 DIAGNOSIS — E611 Iron deficiency: Secondary | ICD-10-CM | POA: Diagnosis not present

## 2022-09-10 DIAGNOSIS — K219 Gastro-esophageal reflux disease without esophagitis: Secondary | ICD-10-CM | POA: Diagnosis not present

## 2022-09-15 DIAGNOSIS — L255 Unspecified contact dermatitis due to plants, except food: Secondary | ICD-10-CM | POA: Diagnosis not present

## 2022-09-17 DIAGNOSIS — Z1331 Encounter for screening for depression: Secondary | ICD-10-CM | POA: Diagnosis not present

## 2022-09-17 DIAGNOSIS — K6389 Other specified diseases of intestine: Secondary | ICD-10-CM | POA: Diagnosis not present

## 2022-09-17 DIAGNOSIS — Z0000 Encounter for general adult medical examination without abnormal findings: Secondary | ICD-10-CM | POA: Diagnosis not present

## 2022-09-17 DIAGNOSIS — Z1339 Encounter for screening examination for other mental health and behavioral disorders: Secondary | ICD-10-CM | POA: Diagnosis not present

## 2022-09-17 DIAGNOSIS — L237 Allergic contact dermatitis due to plants, except food: Secondary | ICD-10-CM | POA: Diagnosis not present

## 2022-09-17 DIAGNOSIS — K219 Gastro-esophageal reflux disease without esophagitis: Secondary | ICD-10-CM | POA: Diagnosis not present

## 2022-09-17 DIAGNOSIS — G4700 Insomnia, unspecified: Secondary | ICD-10-CM | POA: Diagnosis not present

## 2022-09-17 DIAGNOSIS — R252 Cramp and spasm: Secondary | ICD-10-CM | POA: Diagnosis not present

## 2022-09-17 DIAGNOSIS — K52832 Lymphocytic colitis: Secondary | ICD-10-CM | POA: Diagnosis not present

## 2022-09-17 DIAGNOSIS — E039 Hypothyroidism, unspecified: Secondary | ICD-10-CM | POA: Diagnosis not present

## 2022-09-17 DIAGNOSIS — F418 Other specified anxiety disorders: Secondary | ICD-10-CM | POA: Diagnosis not present

## 2022-09-17 DIAGNOSIS — Z820 Family history of epilepsy and other diseases of the nervous system: Secondary | ICD-10-CM | POA: Diagnosis not present

## 2022-09-24 DIAGNOSIS — M9905 Segmental and somatic dysfunction of pelvic region: Secondary | ICD-10-CM | POA: Diagnosis not present

## 2022-09-24 DIAGNOSIS — M9902 Segmental and somatic dysfunction of thoracic region: Secondary | ICD-10-CM | POA: Diagnosis not present

## 2022-09-24 DIAGNOSIS — M9903 Segmental and somatic dysfunction of lumbar region: Secondary | ICD-10-CM | POA: Diagnosis not present

## 2022-09-24 DIAGNOSIS — M9901 Segmental and somatic dysfunction of cervical region: Secondary | ICD-10-CM | POA: Diagnosis not present

## 2022-10-20 DIAGNOSIS — M9905 Segmental and somatic dysfunction of pelvic region: Secondary | ICD-10-CM | POA: Diagnosis not present

## 2022-10-20 DIAGNOSIS — M9901 Segmental and somatic dysfunction of cervical region: Secondary | ICD-10-CM | POA: Diagnosis not present

## 2022-10-20 DIAGNOSIS — M9903 Segmental and somatic dysfunction of lumbar region: Secondary | ICD-10-CM | POA: Diagnosis not present

## 2022-10-20 DIAGNOSIS — M9902 Segmental and somatic dysfunction of thoracic region: Secondary | ICD-10-CM | POA: Diagnosis not present

## 2022-10-22 DIAGNOSIS — E039 Hypothyroidism, unspecified: Secondary | ICD-10-CM | POA: Diagnosis not present

## 2022-10-22 DIAGNOSIS — Z681 Body mass index (BMI) 19 or less, adult: Secondary | ICD-10-CM | POA: Diagnosis not present

## 2022-10-22 DIAGNOSIS — N951 Menopausal and female climacteric states: Secondary | ICD-10-CM | POA: Diagnosis not present

## 2022-11-12 DIAGNOSIS — K580 Irritable bowel syndrome with diarrhea: Secondary | ICD-10-CM | POA: Diagnosis not present

## 2022-11-12 DIAGNOSIS — R109 Unspecified abdominal pain: Secondary | ICD-10-CM | POA: Diagnosis not present

## 2022-11-12 DIAGNOSIS — R140 Abdominal distension (gaseous): Secondary | ICD-10-CM | POA: Diagnosis not present

## 2022-11-12 DIAGNOSIS — R143 Flatulence: Secondary | ICD-10-CM | POA: Diagnosis not present

## 2022-12-18 DIAGNOSIS — M9902 Segmental and somatic dysfunction of thoracic region: Secondary | ICD-10-CM | POA: Diagnosis not present

## 2022-12-18 DIAGNOSIS — M9903 Segmental and somatic dysfunction of lumbar region: Secondary | ICD-10-CM | POA: Diagnosis not present

## 2022-12-18 DIAGNOSIS — M9905 Segmental and somatic dysfunction of pelvic region: Secondary | ICD-10-CM | POA: Diagnosis not present

## 2022-12-18 DIAGNOSIS — M9901 Segmental and somatic dysfunction of cervical region: Secondary | ICD-10-CM | POA: Diagnosis not present

## 2023-01-08 DIAGNOSIS — M9902 Segmental and somatic dysfunction of thoracic region: Secondary | ICD-10-CM | POA: Diagnosis not present

## 2023-01-08 DIAGNOSIS — M9903 Segmental and somatic dysfunction of lumbar region: Secondary | ICD-10-CM | POA: Diagnosis not present

## 2023-01-08 DIAGNOSIS — M9905 Segmental and somatic dysfunction of pelvic region: Secondary | ICD-10-CM | POA: Diagnosis not present

## 2023-01-08 DIAGNOSIS — M9901 Segmental and somatic dysfunction of cervical region: Secondary | ICD-10-CM | POA: Diagnosis not present

## 2023-01-19 DIAGNOSIS — E039 Hypothyroidism, unspecified: Secondary | ICD-10-CM | POA: Diagnosis not present

## 2023-01-21 DIAGNOSIS — E039 Hypothyroidism, unspecified: Secondary | ICD-10-CM | POA: Diagnosis not present

## 2023-01-21 DIAGNOSIS — R5382 Chronic fatigue, unspecified: Secondary | ICD-10-CM | POA: Diagnosis not present

## 2023-01-21 DIAGNOSIS — Z681 Body mass index (BMI) 19 or less, adult: Secondary | ICD-10-CM | POA: Diagnosis not present

## 2023-01-21 DIAGNOSIS — M2550 Pain in unspecified joint: Secondary | ICD-10-CM | POA: Diagnosis not present

## 2023-01-26 DIAGNOSIS — Z961 Presence of intraocular lens: Secondary | ICD-10-CM | POA: Diagnosis not present

## 2023-01-26 DIAGNOSIS — H04123 Dry eye syndrome of bilateral lacrimal glands: Secondary | ICD-10-CM | POA: Diagnosis not present

## 2023-01-26 DIAGNOSIS — H40013 Open angle with borderline findings, low risk, bilateral: Secondary | ICD-10-CM | POA: Diagnosis not present

## 2023-02-04 DIAGNOSIS — K52832 Lymphocytic colitis: Secondary | ICD-10-CM | POA: Diagnosis not present

## 2023-02-04 DIAGNOSIS — L659 Nonscarring hair loss, unspecified: Secondary | ICD-10-CM | POA: Diagnosis not present

## 2023-02-04 DIAGNOSIS — R5383 Other fatigue: Secondary | ICD-10-CM | POA: Diagnosis not present

## 2023-04-06 DIAGNOSIS — R69 Illness, unspecified: Secondary | ICD-10-CM | POA: Diagnosis not present

## 2023-05-20 DIAGNOSIS — Z681 Body mass index (BMI) 19 or less, adult: Secondary | ICD-10-CM | POA: Diagnosis not present

## 2023-05-20 DIAGNOSIS — B009 Herpesviral infection, unspecified: Secondary | ICD-10-CM | POA: Diagnosis not present

## 2023-05-20 DIAGNOSIS — G479 Sleep disorder, unspecified: Secondary | ICD-10-CM | POA: Diagnosis not present

## 2023-05-20 DIAGNOSIS — N951 Menopausal and female climacteric states: Secondary | ICD-10-CM | POA: Diagnosis not present

## 2023-05-20 DIAGNOSIS — R5382 Chronic fatigue, unspecified: Secondary | ICD-10-CM | POA: Diagnosis not present

## 2023-06-08 DIAGNOSIS — R69 Illness, unspecified: Secondary | ICD-10-CM | POA: Diagnosis not present

## 2023-06-26 DIAGNOSIS — M9901 Segmental and somatic dysfunction of cervical region: Secondary | ICD-10-CM | POA: Diagnosis not present

## 2023-06-26 DIAGNOSIS — M9902 Segmental and somatic dysfunction of thoracic region: Secondary | ICD-10-CM | POA: Diagnosis not present

## 2023-06-26 DIAGNOSIS — M9905 Segmental and somatic dysfunction of pelvic region: Secondary | ICD-10-CM | POA: Diagnosis not present

## 2023-06-26 DIAGNOSIS — M9903 Segmental and somatic dysfunction of lumbar region: Secondary | ICD-10-CM | POA: Diagnosis not present

## 2023-07-08 DIAGNOSIS — M9903 Segmental and somatic dysfunction of lumbar region: Secondary | ICD-10-CM | POA: Diagnosis not present

## 2023-07-08 DIAGNOSIS — M9905 Segmental and somatic dysfunction of pelvic region: Secondary | ICD-10-CM | POA: Diagnosis not present

## 2023-07-08 DIAGNOSIS — M9901 Segmental and somatic dysfunction of cervical region: Secondary | ICD-10-CM | POA: Diagnosis not present

## 2023-07-08 DIAGNOSIS — M9902 Segmental and somatic dysfunction of thoracic region: Secondary | ICD-10-CM | POA: Diagnosis not present

## 2023-07-15 DIAGNOSIS — B001 Herpesviral vesicular dermatitis: Secondary | ICD-10-CM | POA: Diagnosis not present

## 2023-07-15 DIAGNOSIS — R002 Palpitations: Secondary | ICD-10-CM | POA: Diagnosis not present

## 2023-07-15 DIAGNOSIS — F418 Other specified anxiety disorders: Secondary | ICD-10-CM | POA: Diagnosis not present

## 2023-07-17 DIAGNOSIS — M9902 Segmental and somatic dysfunction of thoracic region: Secondary | ICD-10-CM | POA: Diagnosis not present

## 2023-07-17 DIAGNOSIS — M9901 Segmental and somatic dysfunction of cervical region: Secondary | ICD-10-CM | POA: Diagnosis not present

## 2023-07-17 DIAGNOSIS — M9905 Segmental and somatic dysfunction of pelvic region: Secondary | ICD-10-CM | POA: Diagnosis not present

## 2023-07-17 DIAGNOSIS — M9903 Segmental and somatic dysfunction of lumbar region: Secondary | ICD-10-CM | POA: Diagnosis not present

## 2023-07-27 ENCOUNTER — Other Ambulatory Visit: Payer: Self-pay

## 2023-07-27 DIAGNOSIS — E079 Disorder of thyroid, unspecified: Secondary | ICD-10-CM | POA: Insufficient documentation

## 2023-07-27 DIAGNOSIS — T7840XA Allergy, unspecified, initial encounter: Secondary | ICD-10-CM | POA: Insufficient documentation

## 2023-07-27 DIAGNOSIS — R002 Palpitations: Secondary | ICD-10-CM | POA: Insufficient documentation

## 2023-07-27 DIAGNOSIS — S62101A Fracture of unspecified carpal bone, right wrist, initial encounter for closed fracture: Secondary | ICD-10-CM | POA: Insufficient documentation

## 2023-07-27 DIAGNOSIS — F418 Other specified anxiety disorders: Secondary | ICD-10-CM | POA: Insufficient documentation

## 2023-07-27 DIAGNOSIS — E039 Hypothyroidism, unspecified: Secondary | ICD-10-CM | POA: Insufficient documentation

## 2023-07-27 DIAGNOSIS — I4891 Unspecified atrial fibrillation: Secondary | ICD-10-CM | POA: Insufficient documentation

## 2023-07-27 DIAGNOSIS — F419 Anxiety disorder, unspecified: Secondary | ICD-10-CM | POA: Insufficient documentation

## 2023-07-27 DIAGNOSIS — K589 Irritable bowel syndrome without diarrhea: Secondary | ICD-10-CM | POA: Insufficient documentation

## 2023-07-28 ENCOUNTER — Ambulatory Visit: Payer: Medicare HMO | Attending: Cardiology | Admitting: Cardiology

## 2023-07-28 ENCOUNTER — Encounter: Payer: Self-pay | Admitting: Cardiology

## 2023-07-28 ENCOUNTER — Ambulatory Visit: Payer: Medicare HMO | Attending: Cardiology

## 2023-07-28 VITALS — BP 100/62 | HR 78 | Ht 68.0 in | Wt 126.8 lb

## 2023-07-28 DIAGNOSIS — R011 Cardiac murmur, unspecified: Secondary | ICD-10-CM

## 2023-07-28 DIAGNOSIS — R002 Palpitations: Secondary | ICD-10-CM

## 2023-07-28 NOTE — Patient Instructions (Signed)
 Medication Instructions:  Your physician recommends that you continue on your current medications as directed. Please refer to the Current Medication list given to you today.  *If you need a refill on your cardiac medications before your next appointment, please call your pharmacy*   Lab Work: None ordered If you have labs (blood work) drawn today and your tests are completely normal, you will receive your results only by: MyChart Message (if you have MyChart) OR A paper copy in the mail If you have any lab test that is abnormal or we need to change your treatment, we will call you to review the results.   Testing/Procedures: Your physician has requested that you have an echocardiogram. Echocardiography is a painless test that uses sound waves to create images of your heart. It provides your doctor with information about the size and shape of your heart and how well your heart's chambers and valves are working. This procedure takes approximately one hour. There are no restrictions for this procedure. Please do NOT wear cologne, perfume, aftershave, or lotions (deodorant is allowed). Please arrive 15 minutes prior to your appointment time.  A zio monitor was ordered today. It will remain on for 14 days. Remove 08/10/23. You will then return monitor and event diary in provided box. It takes 1-2 weeks for report to be downloaded and returned to [place]. We will call you with the results. If monitor falls off or has orange flashing light, please call Zio for further instructions.    Follow-Up: At [facility], you and your health needs are our priority.  As part of our continuing mission to provide you with exceptional heart care, we have created designated Provider Care Teams.  These Care Teams include your primary Cardiologist (physician) and Advanced Practice Providers (APPs -  Physician Assistants and Nurse Practitioners) who all work together to provide you with the care you need, when you need  it.  We recommend signing up for the patient portal called "MyChart".  Sign up information is provided on this After Visit Summary.  MyChart is used to connect with patients for Virtual Visits (Telemedicine).  Patients are able to view lab/test results, encounter notes, upcoming appointments, etc.  Non-urgent messages can be sent to your provider as well.   To learn more about what you can do with MyChart, go to [email or web address].    Your next appointment:   9 month(s)  The format for your next appointment:   In Person  Provider:   Belva Crome, MD   Other Instructions Echocardiogram An echocardiogram is a test that uses sound waves (ultrasound) to produce images of the heart. Images from an echocardiogram can provide important information about: Heart size and shape. The size and thickness and movement of your heart's walls. Heart muscle function and strength. Heart valve function or if you have stenosis. Stenosis is when the heart valves are too narrow. If blood is flowing backward through the heart valves (regurgitation). A tumor or infectious growth around the heart valves. Areas of heart muscle that are not working well because of poor blood flow or injury from a heart attack. Aneurysm detection. An aneurysm is a weak or damaged part of an artery wall. The wall bulges out from the normal force of blood pumping through the body. Tell a health care provider about: Any allergies you have. All medicines you are taking, including vitamins, herbs, eye drops, creams, and over-the-counter medicines. Any blood disorders you have. Any surgeries you have had. Any medical conditions  you have. Whether you are pregnant or may be pregnant. What are the risks? Generally, this is a safe test. However, problems may occur, including an allergic reaction to dye (contrast) that may be used during the test. What happens before the test? No specific preparation is needed. You may eat and  drink normally. What happens during the test? You will take off your clothes from the waist up and put on a hospital gown. Electrodes or electrocardiogram (ECG)patches may be placed on your chest. The electrodes or patches are then connected to a device that monitors your heart rate and rhythm. You will lie down on a table for an ultrasound exam. A gel will be applied to your chest to help sound waves pass through your skin. A handheld device, called a transducer, will be pressed against your chest and moved over your heart. The transducer produces sound waves that travel to your heart and bounce back (or "echo" back) to the transducer. These sound waves will be captured in real-time and changed into images of your heart that can be viewed on a video monitor. The images will be recorded on a computer and reviewed by your health care provider. You may be asked to change positions or hold your breath for a short time. This makes it easier to get different views or better views of your heart. In some cases, you may receive contrast through an IV in one of your veins. This can improve the quality of the pictures from your heart. The procedure may vary among health care providers and hospitals.   What can I expect after the test? You may return to your normal, everyday life, including diet, activities, and medicines, unless your health care provider tells you not to do that. Follow these instructions at home: It is up to you to get the results of your test. Ask your health care provider, or the department that is doing the test, when your results will be ready. Keep all follow-up visits. This is important. Summary An echocardiogram is a test that uses sound waves (ultrasound) to produce images of the heart. Images from an echocardiogram can provide important information about the size and shape of your heart, heart muscle function, heart valve function, and other possible heart problems. You do not need  to do anything to prepare before this test. You may eat and drink normally. After the echocardiogram is completed, you may return to your normal, everyday life, unless your health care provider tells you not to do that. This information is not intended to replace advice given to you by your health care provider. Make sure you discuss any questions you have with your health care provider. Document Revised: 01/17/2020 Document Reviewed: 01/17/2020 Elsevier Patient Education  2021 Elsevier Inc.   Important Information About Sugar

## 2023-07-28 NOTE — Progress Notes (Signed)
 Cardiology Office Note:    Date:  07/28/2023   ID:  Shannon Adkins, DOB 1950-09-22, MRN [id number]  PCP:  Lezlie Lye, Meda Coffee, MD  Cardiologist:  Garwin Brothers, MD   Referring MD: Alysia Penna, MD    ASSESSMENT:    1. Intermittent palpitations    PLAN:    In order of problems listed above:  Primary prevention stressed with the patient.  Importance of compliance with diet medication stressed and patient verbalized standing. She has excellent effort tolerance and I congratulated her about her exercise program. Cardiac murmur: Echocardiogram will be done to assess murmur heard on auscultation. She has palpitations and wonders if she has any arrhythmias so we will do a 2-week monitor.  I reassured her about my findings today.  EKG was unremarkable. Patient will be seen in follow-up appointment in 9 months or earlier if the patient has any concerns.    Medication Adjustments/Labs and Tests Ordered: Current medicines are reviewed at length with the patient today.  Concerns regarding medicines are outlined above.  Orders Placed This Encounter  Procedures   EKG 12-Lead   No orders of the defined types were placed in this encounter.    History of Present Illness:    Shannon Adkins is a 73 y.o. female who is being seen today for the evaluation of palpitations at the request of Alysia Penna, MD. patient is a pleasant 73 year old female.  She is overall healthy and exercises on a regular basis.  She denies any chest pain orthopnea or PND.  She tells me that she has a skipped beat feeling especially when she pushes herself too hard.  No dizziness no lightheadedness no syncope.  At the time of my evaluation, the patient is alert awake oriented and in no distress.  She denies any history of hypertension dyslipidemia or diabetes mellitus.  She is a non-smoker.  She tells me that her thyroid was checked recently by primary care and she was told that it was fine.   This was in December.  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   GERD 02/17/2007   Qualifier: Diagnosis of   By: Tawanna Cooler RN, Ellen         Herpes labialis    Hypothyroidism    Hypothyroidism due to acquired atrophy of thyroid 12/27/2017   IBS (irritable bowel syndrome)    Intermittent palpitations    Irritable bowel syndrome with both constipation and diarrhea 10/27/2016   Right wrist fracture    Thyroid disease     Past Surgical History:  Procedure Laterality Date   CESAREAN SECTION     x 1   HERNIA REPAIR Left    ORIF WRIST FRACTURE Right 02/17/2017   Procedure: OPEN REDUCTION INTERNAL FIXATION (ORIF) WRIST FRACTURE;  Surgeon: Sheral Apley, MD;  Location: [facility];  Service: Orthopedics;  Laterality: Right;   TUBAL LIGATION      Current Medications: Current Meds  Medication Sig   budesonide (ENTOCORT EC) 3 MG 24 hr capsule Take 6 mg by mouth.   escitalopram (LEXAPRO) 10 MG tablet Take 15 mg by mouth daily.   progesterone (PROMETRIUM) 200 MG capsule Take 200 mg  by mouth as directed.   thyroid (NP THYROID) 60 MG tablet Take 60 mg by mouth daily before breakfast.     Allergies:   Amoxicillin, Lactose intolerance (gi), Penicillins, Sulfa antibiotics, and Sulfamethoxazole-trimethoprim   Social History   Socioeconomic History   Marital status: Married    Spouse name: Not on file   Number of children: 3   Years of education: Not on file   Highest education level: Not on file  Occupational History   Not on file  Tobacco Use   Smoking status: Never   Smokeless tobacco: Never  Substance and Sexual Activity   Alcohol use: Yes    Alcohol/week: 1.0 standard  drink of alcohol    Types: 1 Glasses of wine per week    Comment: social   Drug use: No   Sexual activity: Yes  Other Topics Concern   Not on file  Social History Narrative   Marital status: married x 31 years      Children: 3 sons, 1 daughter adopted; one grandchild/son      Lives: with husband, 3 children; oldest son married      Employment: self-employed; 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      Tobacco: never       Alcohol: socially; once per week      Exercise: elliptical; stretches; five days per week      Seatbelt: 100%; no texting   Social Drivers of Corporate investment banker Strain: Not on file  Food Insecurity: Not on file  Transportation Needs: Not on file  Physical Activity: Not on file  Stress: No Stress Concern Present (03/07/2022)   Received from [facility], [facility]   [employer] of Occupational Health - Occupational Stress Questionnaire    Feeling of Stress : Not at all  Social Connections: Unknown (02/20/2022)   Received from [facility], [facility]   Social Network    Social Network: Not on file     Family History: The patient's family history includes Alzheimer's disease in her mother; Angioedema in her sister, sister, and sister; Colon cancer in her maternal grandmother and paternal grandmother; Hypertension in her father; Hypothyroidism in her father, mother, sister, sister, and sister; Osteoporosis in her mother; Stroke in her father.  ROS:   Please see the history of present illness.    All other systems reviewed and are negative.  EKGs/Labs/Other Studies Reviewed:    The following studies were reviewed today:  EKG Interpretation Date/Time:  Tuesday July 28 2023 10:42:42 EST Ventricular Rate:  78 PR Interval:  102 QRS Duration:  84 QT Interval:  384 QTC Calculation: 437 R Axis:   60  Text Interpretation: Unusual P axis and short PR, probable junctional tachycardia with undetermined rhythm irregularity Nonspecific ST abnormality No  previous ECGs available Confirmed by Belva Crome [phone]) on 07/28/2023 11:02:06 AM     Recent Labs: No results found for requested labs within last 365 days.  Recent Lipid Panel    Component Value Date/Time   CHOL 218 (H) 10/21/2019 0918   TRIG 77 10/21/2019 0918   HDL 82 10/21/2019 0918   CHOLHDL 2.7 10/21/2019 0918   CHOLHDL 2.8 01/08/2016 1129   VLDL 23 01/08/2016 1129   LDLCALC 123 (H) 10/21/2019 0918    Physical Exam:    VS:  BP 100/62   Pulse 78   Ht 5\' 8"  (1.727 m)   Wt 126 lb 12.8 oz (57.5 kg)   SpO2 97%   BMI 19.28 kg/m  Wt Readings from Last 3 Encounters:  07/28/23 126 lb 12.8 oz (57.5 kg)  10/21/19 127 lb (57.6 kg)  02/22/19 141 lb 6.4 oz (64.1 kg)     GEN: Patient is in no acute distress HEENT: Normal NECK: No JVD; No carotid bruits LYMPHATICS: No lymphadenopathy CARDIAC: S1 S2 regular, 2/6 systolic murmur at the apex. RESPIRATORY:  Clear to auscultation without rales, wheezing or rhonchi  ABDOMEN: Soft, non-tender, non-distended MUSCULOSKELETAL:  No edema; No deformity  SKIN: Warm and dry NEUROLOGIC:  Alert and oriented x 3 PSYCHIATRIC:  Normal affect    Signed, Garwin Brothers, MD  07/28/2023 11:17 AM    [facility]

## 2023-07-29 DIAGNOSIS — Z961 Presence of intraocular lens: Secondary | ICD-10-CM | POA: Diagnosis not present

## 2023-07-29 DIAGNOSIS — H04123 Dry eye syndrome of bilateral lacrimal glands: Secondary | ICD-10-CM | POA: Diagnosis not present

## 2023-07-29 DIAGNOSIS — H40013 Open angle with borderline findings, low risk, bilateral: Secondary | ICD-10-CM | POA: Diagnosis not present

## 2023-07-30 DIAGNOSIS — M9905 Segmental and somatic dysfunction of pelvic region: Secondary | ICD-10-CM | POA: Diagnosis not present

## 2023-07-30 DIAGNOSIS — M9901 Segmental and somatic dysfunction of cervical region: Secondary | ICD-10-CM | POA: Diagnosis not present

## 2023-07-30 DIAGNOSIS — M9902 Segmental and somatic dysfunction of thoracic region: Secondary | ICD-10-CM | POA: Diagnosis not present

## 2023-07-30 DIAGNOSIS — M9903 Segmental and somatic dysfunction of lumbar region: Secondary | ICD-10-CM | POA: Diagnosis not present

## 2023-08-13 ENCOUNTER — Ambulatory Visit (HOSPITAL_BASED_OUTPATIENT_CLINIC_OR_DEPARTMENT_OTHER)
Admission: RE | Admit: 2023-08-13 | Discharge: 2023-08-13 | Disposition: A | Discharge status: Home / Self Care | Payer: Medicare HMO | Source: Ambulatory Visit | Attending: Cardiology | Admitting: Cardiology

## 2023-08-13 DIAGNOSIS — R011 Cardiac murmur, unspecified: Secondary | ICD-10-CM | POA: Diagnosis not present

## 2023-08-13 LAB — ECHOCARDIOGRAM COMPLETE
AR max vel: 2.96 cm2
AV Area VTI: 2.9 cm2
AV Area mean vel: 2.72 cm2
AV Mean grad: 5 mmHg
AV Peak grad: 9.4 mmHg
AV Vena cont: 0.4 cm
Ao pk vel: 1.54 m/s
Area-P 1/2: 2.87 cm2
Calc EF: 60.4 %
MV M vel: 4.61 m/s
MV Peak grad: 85 mmHg
MV Vena cont: 0.2 cm
P 1/2 time: 914 ms
Radius: 0.3 cm
S' Lateral: 2.8 cm
Single Plane A2C EF: 62.7 %
Single Plane A4C EF: 60 %

## 2023-08-18 DIAGNOSIS — R002 Palpitations: Secondary | ICD-10-CM

## 2023-10-02 DIAGNOSIS — M9902 Segmental and somatic dysfunction of thoracic region: Secondary | ICD-10-CM | POA: Diagnosis not present

## 2023-10-02 DIAGNOSIS — M9903 Segmental and somatic dysfunction of lumbar region: Secondary | ICD-10-CM | POA: Diagnosis not present

## 2023-10-02 DIAGNOSIS — M9901 Segmental and somatic dysfunction of cervical region: Secondary | ICD-10-CM | POA: Diagnosis not present

## 2023-10-02 DIAGNOSIS — M9905 Segmental and somatic dysfunction of pelvic region: Secondary | ICD-10-CM | POA: Diagnosis not present

## 2023-10-05 DIAGNOSIS — E039 Hypothyroidism, unspecified: Secondary | ICD-10-CM | POA: Diagnosis not present

## 2023-10-05 DIAGNOSIS — E611 Iron deficiency: Secondary | ICD-10-CM | POA: Diagnosis not present

## 2023-10-12 DIAGNOSIS — K52832 Lymphocytic colitis: Secondary | ICD-10-CM | POA: Diagnosis not present

## 2023-10-12 DIAGNOSIS — K6389 Other specified diseases of intestine: Secondary | ICD-10-CM | POA: Diagnosis not present

## 2023-10-12 DIAGNOSIS — G4700 Insomnia, unspecified: Secondary | ICD-10-CM | POA: Diagnosis not present

## 2023-10-12 DIAGNOSIS — Z820 Family history of epilepsy and other diseases of the nervous system: Secondary | ICD-10-CM | POA: Diagnosis not present

## 2023-10-12 DIAGNOSIS — E039 Hypothyroidism, unspecified: Secondary | ICD-10-CM | POA: Diagnosis not present

## 2023-10-12 DIAGNOSIS — R002 Palpitations: Secondary | ICD-10-CM | POA: Diagnosis not present

## 2023-10-12 DIAGNOSIS — Z1339 Encounter for screening examination for other mental health and behavioral disorders: Secondary | ICD-10-CM | POA: Diagnosis not present

## 2023-10-12 DIAGNOSIS — K219 Gastro-esophageal reflux disease without esophagitis: Secondary | ICD-10-CM | POA: Diagnosis not present

## 2023-10-12 DIAGNOSIS — F418 Other specified anxiety disorders: Secondary | ICD-10-CM | POA: Diagnosis not present

## 2023-10-12 DIAGNOSIS — E611 Iron deficiency: Secondary | ICD-10-CM | POA: Diagnosis not present

## 2023-10-12 DIAGNOSIS — Z1331 Encounter for screening for depression: Secondary | ICD-10-CM | POA: Diagnosis not present

## 2023-10-12 DIAGNOSIS — Z0000 Encounter for general adult medical examination without abnormal findings: Secondary | ICD-10-CM | POA: Diagnosis not present

## 2023-10-13 ENCOUNTER — Other Ambulatory Visit: Payer: Self-pay | Admitting: Internal Medicine

## 2023-10-13 ENCOUNTER — Telehealth: Payer: Self-pay

## 2023-10-13 DIAGNOSIS — Z1231 Encounter for screening mammogram for malignant neoplasm of breast: Secondary | ICD-10-CM

## 2023-10-13 NOTE — Telephone Encounter (Signed)
 Pt was seen by Dr. Yehuda Helms yesterday and advised his office that she has been having increased palpations that are bothering her. BP was 118/74. Office is faxing note from yesterday. Please advise

## 2023-10-14 ENCOUNTER — Ambulatory Visit: Payer: Self-pay

## 2023-10-14 ENCOUNTER — Ambulatory Visit
Admission: RE | Admit: 2023-10-14 | Discharge: 2023-10-14 | Discharge status: Home / Self Care | Payer: Self-pay | Source: Ambulatory Visit | Attending: Internal Medicine | Admitting: Internal Medicine

## 2023-10-14 DIAGNOSIS — Z1231 Encounter for screening mammogram for malignant neoplasm of breast: Secondary | ICD-10-CM | POA: Diagnosis not present

## 2023-10-14 MED ORDER — METOPROLOL TARTRATE 25 MG PO TABS: 12.5000 mg | ORAL_TABLET | Freq: Two times a day (BID) | ORAL | 3 refills | Status: DC

## 2023-10-14 NOTE — Telephone Encounter (Signed)
 Recommendations reviewed with pt as per Dr. Kem Parkinson note.  Pt verbalized understanding and had no additional questions.

## 2023-11-10 ENCOUNTER — Encounter: Payer: Self-pay | Admitting: Cardiology

## 2023-11-24 DIAGNOSIS — M9905 Segmental and somatic dysfunction of pelvic region: Secondary | ICD-10-CM | POA: Diagnosis not present

## 2023-11-24 DIAGNOSIS — M9902 Segmental and somatic dysfunction of thoracic region: Secondary | ICD-10-CM | POA: Diagnosis not present

## 2023-11-24 DIAGNOSIS — M9903 Segmental and somatic dysfunction of lumbar region: Secondary | ICD-10-CM | POA: Diagnosis not present

## 2023-11-24 DIAGNOSIS — M9901 Segmental and somatic dysfunction of cervical region: Secondary | ICD-10-CM | POA: Diagnosis not present

## 2023-12-12 DIAGNOSIS — L237 Allergic contact dermatitis due to plants, except food: Secondary | ICD-10-CM | POA: Diagnosis not present

## 2023-12-14 DIAGNOSIS — L247 Irritant contact dermatitis due to plants, except food: Secondary | ICD-10-CM | POA: Diagnosis not present

## 2023-12-29 DIAGNOSIS — M9902 Segmental and somatic dysfunction of thoracic region: Secondary | ICD-10-CM | POA: Diagnosis not present

## 2023-12-29 DIAGNOSIS — M9905 Segmental and somatic dysfunction of pelvic region: Secondary | ICD-10-CM | POA: Diagnosis not present

## 2023-12-29 DIAGNOSIS — M9903 Segmental and somatic dysfunction of lumbar region: Secondary | ICD-10-CM | POA: Diagnosis not present

## 2023-12-29 DIAGNOSIS — M9901 Segmental and somatic dysfunction of cervical region: Secondary | ICD-10-CM | POA: Diagnosis not present

## 2024-01-04 DIAGNOSIS — M9905 Segmental and somatic dysfunction of pelvic region: Secondary | ICD-10-CM | POA: Diagnosis not present

## 2024-01-04 DIAGNOSIS — M9903 Segmental and somatic dysfunction of lumbar region: Secondary | ICD-10-CM | POA: Diagnosis not present

## 2024-01-04 DIAGNOSIS — M9901 Segmental and somatic dysfunction of cervical region: Secondary | ICD-10-CM | POA: Diagnosis not present

## 2024-01-04 DIAGNOSIS — M9902 Segmental and somatic dysfunction of thoracic region: Secondary | ICD-10-CM | POA: Diagnosis not present

## 2024-01-26 DIAGNOSIS — M9903 Segmental and somatic dysfunction of lumbar region: Secondary | ICD-10-CM | POA: Diagnosis not present

## 2024-01-26 DIAGNOSIS — M9902 Segmental and somatic dysfunction of thoracic region: Secondary | ICD-10-CM | POA: Diagnosis not present

## 2024-01-26 DIAGNOSIS — M9901 Segmental and somatic dysfunction of cervical region: Secondary | ICD-10-CM | POA: Diagnosis not present

## 2024-01-26 DIAGNOSIS — M9905 Segmental and somatic dysfunction of pelvic region: Secondary | ICD-10-CM | POA: Diagnosis not present

## 2024-04-27 ENCOUNTER — Encounter: Payer: Self-pay | Admitting: Cardiology

## 2024-04-27 ENCOUNTER — Ambulatory Visit: Attending: Cardiology | Admitting: Cardiology

## 2024-04-27 VITALS — BP 112/72 | HR 70 | Ht 68.0 in | Wt 128.2 lb

## 2024-04-27 DIAGNOSIS — R002 Palpitations: Secondary | ICD-10-CM

## 2024-04-27 DIAGNOSIS — E782 Mixed hyperlipidemia: Secondary | ICD-10-CM | POA: Insufficient documentation

## 2024-04-27 NOTE — Patient Instructions (Signed)

## 2024-04-27 NOTE — Progress Notes (Signed)
 Cardiology Office Note:    Date:  04/27/2024   ID:  Shannon Adkins, DOB December 30, 1950, MRN [id number]  PCP:  Melonie Colonel, Mikel HERO, MD  Cardiologist:  Jennifer JONELLE Crape, MD   Referring MD: Melonie Colonel, Mikel HERO, *    ASSESSMENT:    1. Intermittent palpitations   2. Mixed dyslipidemia    PLAN:    In order of problems listed above:  Primary prevention stressed with the patient.  Importance of compliance with diet medication stressed and patient verbalized standing. I congratulated ordering exercise protocol which is excellent Palpitations: Several months completely resolved and she is happy about it. Mixed dyslipidemia: I discussed calcium scoring for risk stratification.  She wants to think about it.  Diet was emphasized for elevated lipids and she vocalized understanding and questions were answered to her satisfaction. Patient will be seen in follow-up appointment in 12 months or earlier if the patient has any concerns.    Medication Adjustments/Labs and Tests Ordered: Current medicines are reviewed at length with the patient today.  Concerns regarding medicines are outlined above.  No orders of the defined types were placed in this encounter.  No orders of the defined types were placed in this encounter.    No chief complaint on file.    History of Present Illness:    Shannon Adkins is a 73 y.o. female.  Patient has past medical history of palpitations.  She exercises on a regular basis.  She has mixed dyslipidemia.  She denies any problems at this time and takes care of her activities of daily living.  No chest pain orthopnea or PND.  At the time of my evaluation, the patient is alert awake oriented and in no distress.  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   GERD 02/17/2007   Qualifier: Diagnosis of   By: Krystal, RN, Leeroy         Herpes labialis    Hypothyroidism    Hypothyroidism due to acquired atrophy of thyroid  12/27/2017   IBS (irritable bowel syndrome)    Intermittent palpitations    Irritable bowel syndrome with both constipation and diarrhea 10/27/2016   Right wrist fracture    Thyroid  disease     Past Surgical History:  Procedure Laterality Date   BREAST EXCISIONAL BIOPSY     CESAREAN SECTION     x 1   HERNIA REPAIR Left    ORIF WRIST FRACTURE Right 02/17/2017   Procedure: OPEN REDUCTION INTERNAL FIXATION (ORIF) WRIST FRACTURE;  Surgeon: Beverley Evalene BIRCH, MD;  Location: [facility];  Service: Orthopedics;  Laterality: Right;   TUBAL LIGATION      Current Medications: Current Meds  Medication Sig   cycloSPORINE (RESTASIS) 0.05 % ophthalmic emulsion Place 1 drop into both eyes 2 (two) times daily.   escitalopram (LEXAPRO) 10 MG tablet Take 15 mg by mouth daily.   progesterone  (PROMETRIUM ) 200 MG capsule Take 200 mg by mouth as directed.   thyroid  (NP THYROID ) 60 MG tablet Take 60 mg by mouth daily before breakfast.   valACYclovir  (VALTREX ) 500 MG tablet Take 500 mg by mouth as needed.     Allergies:   Amoxicillin ,  Lactose intolerance (gi), Penicillins, Sulfa antibiotics, and Sulfamethoxazole-trimethoprim   Social History   Socioeconomic History   Marital status: Married    Spouse name: Not on file   Number of children: 3   Years of education: Not on file   Highest education level: Not on file  Occupational History   Not on file  Tobacco Use   Smoking status: Never   Smokeless tobacco: Never  Substance and Sexual Activity   Alcohol  use: Yes    Alcohol /week: 1.0 standard drink of alcohol     Types: 1 Glasses of wine per week    Comment: social   Drug use: No   Sexual activity: Yes  Other  Topics Concern   Not on file  Social History Narrative   Marital status: married x 31 years      Children: 3 sons, 1 daughter adopted; one grandchild/son      Lives: with husband, 3 children; oldest son married      Employment: self-employed; 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      Tobacco: never       Alcohol : socially; once per week      Exercise: elliptical; stretches; five days per week      Seatbelt: 100%; no texting   Social Drivers of Corporate Investment Banker Strain: Not on file  Food Insecurity: Not on file  Transportation Needs: Not on file  Physical Activity: Not on file  Stress: No Stress Concern Present (03/07/2022)   Received from [facility] of Occupational Health - Occupational Stress Questionnaire    Feeling of Stress : Not at all  Social Connections: Unknown (02/20/2022)   Received from [facility]   Social Network    Social Network: Not on file     Family History: The patient's family history includes Alzheimer's disease in her mother; Angioedema in her sister, sister, and sister; Colon cancer in her maternal grandmother and paternal grandmother; Hypertension in her father; Hypothyroidism in her father, mother, sister, sister, and sister; Osteoporosis in her mother; Stroke in her father.  ROS:   Please see the history of present illness.    All other systems reviewed and are negative.  EKGs/Labs/Other Studies Reviewed:    The following studies were reviewed today: I discussed my findings with the patient at length   Recent Labs: No results found for requested labs within last 365 days.  Recent Lipid Panel    Component Value Date/Time   CHOL 218 (H) 10/21/2019 0918   TRIG 77 10/21/2019 0918   HDL 82 10/21/2019 0918   CHOLHDL 2.7 10/21/2019 0918   CHOLHDL 2.8 01/08/2016 1129   VLDL 23 01/08/2016 1129   LDLCALC 123 (H) 10/21/2019 0918    Physical Exam:    VS:  BP 112/72   Pulse 70   Ht 5' 8 (1.727 m)   Wt 128 lb 3.2 oz (58.2 kg)    SpO2 97%   BMI 19.49 kg/m     Wt Readings from Last 3 Encounters:  04/27/24 128 lb 3.2 oz (58.2 kg)  07/28/23 126 lb 12.8 oz (57.5 kg)  10/21/19 127 lb (57.6 kg)     GEN: Patient is in no acute distress HEENT: Normal NECK: No JVD; No carotid bruits LYMPHATICS: No lymphadenopathy CARDIAC: Hear sounds regular, 2/6 systolic murmur at the apex. RESPIRATORY:  Clear to auscultation without rales, wheezing or rhonchi  ABDOMEN: Soft, non-tender, non-distended MUSCULOSKELETAL:  No edema; No deformity  SKIN: Warm and dry NEUROLOGIC:  Alert and oriented x 3 PSYCHIATRIC:  Normal affect   Signed, Jennifer JONELLE Crape, MD  04/27/2024 10:59 AM    [facility]
